# Patient Record
Sex: Female | Born: 1950 | Race: White | Hispanic: No | Smoking: Current every day smoker
Health system: Southern US, Community
[De-identification: ages and names within clinical notes are randomized; demographics above are authoritative.]

## PROBLEM LIST (undated history)

## (undated) DIAGNOSIS — K219 Gastro-esophageal reflux disease without esophagitis: Secondary | ICD-10-CM

## (undated) DIAGNOSIS — F329 Major depressive disorder, single episode, unspecified: Secondary | ICD-10-CM

## (undated) DIAGNOSIS — J45909 Unspecified asthma, uncomplicated: Secondary | ICD-10-CM

## (undated) DIAGNOSIS — R519 Headache, unspecified: Secondary | ICD-10-CM

## (undated) DIAGNOSIS — E785 Hyperlipidemia, unspecified: Secondary | ICD-10-CM

## (undated) DIAGNOSIS — N289 Disorder of kidney and ureter, unspecified: Secondary | ICD-10-CM

## (undated) DIAGNOSIS — I10 Essential (primary) hypertension: Secondary | ICD-10-CM

## (undated) DIAGNOSIS — G5600 Carpal tunnel syndrome, unspecified upper limb: Secondary | ICD-10-CM

## (undated) DIAGNOSIS — F32A Depression, unspecified: Secondary | ICD-10-CM

## (undated) DIAGNOSIS — M797 Fibromyalgia: Secondary | ICD-10-CM

## (undated) DIAGNOSIS — F319 Bipolar disorder, unspecified: Secondary | ICD-10-CM

## (undated) DIAGNOSIS — C801 Malignant (primary) neoplasm, unspecified: Secondary | ICD-10-CM

## (undated) DIAGNOSIS — R51 Headache: Secondary | ICD-10-CM

## (undated) HISTORY — PX: OTHER SURGICAL HISTORY: SHX169

## (undated) HISTORY — DX: Malignant (primary) neoplasm, unspecified: C80.1

## (undated) HISTORY — PX: TONSILLECTOMY: SUR1361

## (undated) HISTORY — PX: APPENDECTOMY: SHX54

## (undated) HISTORY — PX: ABDOMINAL HYSTERECTOMY: SHX81

---

## 1998-05-08 ENCOUNTER — Ambulatory Visit (HOSPITAL_COMMUNITY): Admission: RE | Admit: 1998-05-08 | Discharge: 1998-05-08 | Payer: Self-pay

## 1999-01-22 ENCOUNTER — Emergency Department (HOSPITAL_COMMUNITY): Admission: EM | Admit: 1999-01-22 | Discharge: 1999-01-22 | Payer: Self-pay

## 1999-01-23 ENCOUNTER — Encounter: Payer: Self-pay | Admitting: Emergency Medicine

## 1999-10-24 ENCOUNTER — Ambulatory Visit (HOSPITAL_COMMUNITY): Admission: RE | Admit: 1999-10-24 | Discharge: 1999-10-24 | Payer: Self-pay | Admitting: *Deleted

## 2001-12-16 ENCOUNTER — Ambulatory Visit: Admission: RE | Admit: 2001-12-16 | Discharge: 2001-12-16 | Payer: Self-pay | Admitting: *Deleted

## 2002-04-27 ENCOUNTER — Encounter: Admission: RE | Admit: 2002-04-27 | Discharge: 2002-04-27 | Payer: Self-pay | Admitting: Family Medicine

## 2002-04-27 ENCOUNTER — Encounter: Payer: Self-pay | Admitting: Family Medicine

## 2002-11-14 ENCOUNTER — Other Ambulatory Visit: Admission: RE | Admit: 2002-11-14 | Discharge: 2002-11-14 | Payer: Self-pay | Admitting: Family Medicine

## 2002-12-22 ENCOUNTER — Encounter: Payer: Self-pay | Admitting: Family Medicine

## 2002-12-22 ENCOUNTER — Encounter: Admission: RE | Admit: 2002-12-22 | Discharge: 2002-12-22 | Payer: Self-pay | Admitting: Family Medicine

## 2006-07-24 ENCOUNTER — Emergency Department (HOSPITAL_COMMUNITY): Admission: EM | Admit: 2006-07-24 | Discharge: 2006-07-24 | Payer: Self-pay | Admitting: Emergency Medicine

## 2006-09-13 ENCOUNTER — Emergency Department (HOSPITAL_COMMUNITY): Admission: EM | Admit: 2006-09-13 | Discharge: 2006-09-13 | Payer: Self-pay | Admitting: Emergency Medicine

## 2006-09-26 ENCOUNTER — Emergency Department (HOSPITAL_COMMUNITY): Admission: EM | Admit: 2006-09-26 | Discharge: 2006-09-27 | Payer: Self-pay | Admitting: Emergency Medicine

## 2007-08-22 ENCOUNTER — Inpatient Hospital Stay (HOSPITAL_COMMUNITY): Admission: EM | Admit: 2007-08-22 | Discharge: 2007-08-28 | Payer: Self-pay | Admitting: Emergency Medicine

## 2007-08-24 ENCOUNTER — Ambulatory Visit: Payer: Self-pay | Admitting: Psychiatry

## 2008-06-16 ENCOUNTER — Emergency Department (HOSPITAL_COMMUNITY): Admission: EM | Admit: 2008-06-16 | Discharge: 2008-06-16 | Payer: Self-pay | Admitting: Emergency Medicine

## 2008-06-26 ENCOUNTER — Emergency Department (HOSPITAL_COMMUNITY): Admission: EM | Admit: 2008-06-26 | Discharge: 2008-06-26 | Payer: Self-pay | Admitting: Emergency Medicine

## 2008-08-23 ENCOUNTER — Emergency Department (HOSPITAL_COMMUNITY): Admission: EM | Admit: 2008-08-23 | Discharge: 2008-08-23 | Payer: Self-pay | Admitting: Emergency Medicine

## 2008-12-20 ENCOUNTER — Inpatient Hospital Stay (HOSPITAL_COMMUNITY): Admission: RE | Admit: 2008-12-20 | Discharge: 2008-12-24 | Payer: Self-pay | Admitting: Urology

## 2008-12-20 ENCOUNTER — Encounter: Payer: Self-pay | Admitting: Urology

## 2009-07-27 ENCOUNTER — Emergency Department (HOSPITAL_BASED_OUTPATIENT_CLINIC_OR_DEPARTMENT_OTHER)
Admission: EM | Admit: 2009-07-27 | Discharge: 2009-07-27 | Payer: Self-pay | Source: Home / Self Care | Admitting: Emergency Medicine

## 2009-12-07 ENCOUNTER — Encounter (HOSPITAL_COMMUNITY)
Admission: RE | Admit: 2009-12-07 | Discharge: 2010-02-06 | Payer: Self-pay | Source: Home / Self Care | Admitting: Specialist

## 2010-06-22 ENCOUNTER — Encounter: Payer: Self-pay | Admitting: Family Medicine

## 2010-06-23 ENCOUNTER — Encounter: Payer: Self-pay | Admitting: Family Medicine

## 2010-07-01 ENCOUNTER — Emergency Department (HOSPITAL_COMMUNITY)
Admission: EM | Admit: 2010-07-01 | Discharge: 2010-07-01 | Payer: Self-pay | Source: Home / Self Care | Admitting: Emergency Medicine

## 2010-07-01 LAB — POCT I-STAT, CHEM 8
BUN: 10 mg/dL (ref 6–23)
Calcium, Ion: 0.91 mmol/L — ABNORMAL LOW (ref 1.12–1.32)
Chloride: 90 mEq/L — ABNORMAL LOW (ref 96–112)
Glucose, Bld: 107 mg/dL — ABNORMAL HIGH (ref 70–99)
HCT: 36 % (ref 36.0–46.0)
TCO2: 31 mmol/L (ref 0–100)

## 2010-07-01 LAB — POCT CARDIAC MARKERS

## 2010-07-01 LAB — CBC
MCH: 30.5 pg (ref 26.0–34.0)
MCV: 90.3 fL (ref 78.0–100.0)
Platelets: 256 10*3/uL (ref 150–400)
RDW: 13 % (ref 11.5–15.5)

## 2010-07-01 LAB — DIFFERENTIAL
Basophils Relative: 0 % (ref 0–1)
Eosinophils Absolute: 0.2 10*3/uL (ref 0.0–0.7)
Eosinophils Relative: 2 % (ref 0–5)
Lymphs Abs: 1.4 10*3/uL (ref 0.7–4.0)
Monocytes Relative: 6 % (ref 3–12)

## 2010-09-08 LAB — DIFFERENTIAL
Basophils Absolute: 0 10*3/uL (ref 0.0–0.1)
Basophils Relative: 0 % (ref 0–1)
Eosinophils Absolute: 0 10*3/uL (ref 0.0–0.7)
Monocytes Absolute: 0.8 10*3/uL (ref 0.1–1.0)
Monocytes Relative: 7 % (ref 3–12)
Neutro Abs: 10.3 10*3/uL — ABNORMAL HIGH (ref 1.7–7.7)

## 2010-09-08 LAB — CBC
Hemoglobin: 14.5 g/dL (ref 12.0–15.0)
MCHC: 33.2 g/dL (ref 30.0–36.0)
MCV: 92.3 fL (ref 78.0–100.0)
RBC: 4.73 MIL/uL (ref 3.87–5.11)
RDW: 14.9 % (ref 11.5–15.5)

## 2010-09-08 LAB — BASIC METABOLIC PANEL
BUN: 7 mg/dL (ref 6–23)
BUN: 7 mg/dL (ref 6–23)
CO2: 31 mEq/L (ref 19–32)
CO2: 31 mEq/L (ref 19–32)
CO2: 34 mEq/L — ABNORMAL HIGH (ref 19–32)
Calcium: 8.4 mg/dL (ref 8.4–10.5)
Chloride: 93 mEq/L — ABNORMAL LOW (ref 96–112)
Chloride: 94 mEq/L — ABNORMAL LOW (ref 96–112)
Chloride: 97 mEq/L (ref 96–112)
Creatinine, Ser: 0.92 mg/dL (ref 0.4–1.2)
Creatinine, Ser: 1.02 mg/dL (ref 0.4–1.2)
Glucose, Bld: 111 mg/dL — ABNORMAL HIGH (ref 70–99)
Glucose, Bld: 133 mg/dL — ABNORMAL HIGH (ref 70–99)
Potassium: 3.5 mEq/L (ref 3.5–5.1)
Potassium: 4.1 mEq/L (ref 3.5–5.1)

## 2010-10-15 NOTE — Op Note (Signed)
NAMEMarland Kitchen  Theresa Pennington, Theresa Pennington                ACCOUNT NO.:  [id number]   MEDICAL RECORD NO.:  [id number]          PATIENT TYPE:  INP   LOCATION:  1433                         FACILITY:  [facility]   PHYSICIAN:  Bertram Millard. Dahlstedt, M.D.DATE OF BIRTH:  [birth year]   DATE OF PROCEDURE:  12/20/2008  DATE OF DISCHARGE:                               OPERATIVE REPORT   PREOPERATIVE DIAGNOSIS:  Right renal mass.   POSTOPERATIVE DIAGNOSIS:  Right renal mass.   PROCEDURE PERFORMED:  1. Right laparoscopic radical nephrectomy.  2. Laparoscopic lysis of adhesions.   SURGEON:  Dr. Marcine Matar.   RESIDENT:  Edward Qualia.   NURSE PRACTITIONER:  Theresa Duty.   DRAINS:  A 16-French Foley catheter to gravity.   COMPLICATIONS:  None.   ANESTHESIA:  General endotracheal anesthesia.   INDICATIONS FOR PROCEDURE:  Theresa Pennington is a 60 year old Caucasian female  who was found to have a right renal mass approximately 6 cm in size.  This was emanating from the upper pole.  The contralateral kidney is  normal.  She is felt to have localized disease based on her preoperative  evaluation and we therefore recommended radical nephrectomy.  After  informed consent was obtained, she was brought to the operating room for  the above-mentioned procedure.   DESCRIPTION OF PROCEDURE IN DETAIL:  Mr. Cabeza was brought back to the  operating room and identified via wrist band.  A preoperative timeout  was called to confirm correct patient, procedure and site.  After the  successful induction of general anesthesia, she was positioned in the  modified flank position using a beanbag.  All of her pressure points  were padded appropriately to help reduce the risk of neurapraxia.  A  Foley catheter was inserted into her bladder.  IV antibiotics were  administered.  Her abdomen and flank were prepped and draped in the  usual sterile fashion.  The surgeons were sterilely gowned and gloved.    Approximately 1 cm above the  umbilicus, a 1 cm incision was made.  We  dissected down through Scarpa's and the rectus sheath using a Hassan  technique.  We placed our camera port.  We inspected the abdomen, there  were some adhesions located in the midline.  We inserted a 5 mm port in  the subxiphoid region and an additional 5 mm port was inserted and the  right upper quadrant.  We performed a laparoscopic lysis of adhesions  using sharp and blunt dissection.  Another 5 mm port was then positioned  in the right lower quadrant.   We began by mobilizing the right colon.  Once the right colon was  mobilized, we identified the inferior pole of the right kidney.  We  dissected down to Gerota's fascia.  We then worked our way laterally and  freed up the lateral edge of the kidney.  Once this was accomplished, we  worked towards the upper pole the right kidney where the mass was  evident.  The hepatorenal ligament was divided.  We then proceeded to  identification of the right ureter and right gonadal vein which  was seen  overlying the psoas muscle.  The gonadal and right ureter were then  clipped with Hem-o-lok clips x2.  These were subsequently divided and  the inferior pole of the kidney was further mobilized.   We then turned our dissection towards the hilum. Gonadal vessels were  identified as was the vena cava.  The renal vein was seen coming into  the vena cava.  We then were able to identify a branch of the renal  artery just inferior and posterior to the renal vein.  This was  carefully isolated and subsequently two Hem-o-lok clips were secured and  placed on this renal artery.  We then proceeded superiorly and there was  evidence of another branch to renal artery was still perfusing the upper  pole of the kidney.  This was subsequently isolated and Hem-o-lok clips  were applied x3.  Both of these renal arteries once there were some  secured, there were subsequently divided.  The renal vein was then  secured with  Hem-o-lok clips x3 and divided as well.  The specimen was  then freed from its remaining attachments using blunt and sharp  dissection.  The specimen was then placed in a large EndoCatch bag.  The  adrenal was taken with the specimen as the patient had a large tumor in  the upper pole.  The bed of the wound was then closely inspected as was  the hilum.  Hemostasis was excellent.   The midline incision was then extended approximately 4 cm to retrieve  the specimen.  The fascia of the midline wound was then closed using #1  PDS in a running fashion.  The skin was then closed with staples and the  procedure was terminated.   Please note, Dr. Marcine Matar was present, available, and  participated in all aspects of this patient's care.   DISPOSITION:  The patient tolerated the procedure well, was extubated  and transported to the PACU in stable condition.     ______________________________  Nani Ravens. Dahlstedt, M.D.  Electronically Signed    KR/MEDQ  D:  12/22/2008  T:  12/22/2008  Job:  [id number]
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
## 2010-10-15 NOTE — Discharge Summary (Signed)
NAMEJEDA, Theresa Pennington                ACCOUNT NO.:  [id number]   MEDICAL RECORD NO.:  [id number]          PATIENT TYPE:  INP   LOCATION:  1501                         [facility]   PHYSICIAN:  Herbie Saxon, MDDATE OF BIRTH:  May 25, 1951   DATE OF ADMISSION:  08/22/2007  DATE OF DISCHARGE:  08/26/2007                               DISCHARGE SUMMARY   DISCHARGE DIAGNOSES:  1. Altered mental status, resolved.  2. Urinary tract infection.  3. Depression.  4. Chronic pain syndrome.  5. Fibromyalgia.  6. Hypertension, stable.  7. Chronic lacunar infarcts.  8. Dyslipidemia.  9. Hyponatremia, improved.  10.Tobacco abuse.  11.Leukocytosis, resolved.  12.Anemia of chronic disease.   CONSULTATIONS:  1. Dr. Doristine Counter, psychiatry.  2. Palliative care team for pain management evaluation.  This was      performed by Asencion Noble, NP   HOSPITAL COURSE:  This 60 year old Caucasian lady with a known history  of psychotic disorder having been seen by Jamas Lav, M.D.,  presented to the emergency room at [facility] with  confusion and wondering around.  The patient's speech was disoriented.  She was hyponatremic with a sodium of 129.  Urinalysis was consistent  with a urinary tract infection with 20 white blood cells and large  leukocyte esterase.  The head CT showed some lacunar infarcts, but MRI  confirmed that these are old infarcts.  No acute infarcts are  identified.  The patient was started on IV Cipro and IV fluid normal  saline hydration.  Her urinary tract infection and hyponatremia is much  improved.  The patient is ambulating, unable to converse appropriately.  Amlodipine was added to optimize her blood pressure control and she was  also on p.r.n. laxatives and she has been sent home on stool softeners  as she has chronic constipation.   CONDITION ON DISCHARGE:  Stable.   DIET:  Low sodium, heart healthy, low cholesterol.   ACTIVITY:   Increase slowly.  She has been ordered a soft cervical  collar.   FOLLOWUP:  Follow up with primary care physician, Dr. Biagio Quint at [facility], however, the patient may wish to also follow up at [facility] in 1 week per her desire.  Follow up with psychiatrist, Dr.  Raquel James in 3-5 days.   DISCHARGE MEDICATIONS:  1. Cipro 250 mg b.i.d. for 3 more days.  2. Colace 100 mg b.i.d.  3. MiraLax 17 grams p.o. daily as necessary.  4. Amlodipine 5 mg p.o. daily.  5. Soft cervical collar.  6. Hemocyte Plus one tablet daily.   We will continue with all of her previous home medications and this is  to be reviewed by her primary care physician as an outpatient.   PHYSICAL EXAMINATION:  GENERAL:  She is an elderly lady in no acute  distress.  VITAL SIGNS:  Temperature 98, pulse 100, respiratory rate 21, blood  pressure 140/80.  HEENT:  Pupils equal, round, and reactive to light and accommodation.  NECK:  Supple.  No submandibular lymph nodes.  HEART:  Sounds 1 and 2  regular.  CHEST:  Clinically clear.  ABDOMEN:  Soft, nontender, and no organomegaly.  Bowel sounds are  normal.  NEUROLOGY:  She is alert and oriented to time, place, and person.  Peripheral pulses are present with no pedal edema.  Deep tendon reflexes  are 2+ globally.  Power is 5 globally.   LABORATORY DATA:  Sodium 134, potassium 4.2, chloride 102, bicarbonate  27, glucose 104, BUN 5, creatinine 0.7.  WBC 7, hematocrit 27.6,  platelet count 382.  Blood culture negative so far.   RADIOLOGY:  The MRI of brain on August 24, 2007, shows no evidence of  acute ischemia.  There are probable old ischemic lacuna involving the  left frontal subcortical white matter.   Chest x-ray on August 22, 2007, showed no active cardiopulmonary disease.      Herbie Saxon, MD  Electronically Signed     MIO/MEDQ  D:  08/26/2007  T:  08/26/2007  Job:  [id number]   cc:   Dr. Margarette Asal, Burna Forts, M.D.  Fax:  [phone]
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
## 2010-10-15 NOTE — Consult Note (Signed)
NAME:  Theresa Pennington, Theresa Pennington NO.:  [id number]   MEDICAL RECORD NO.:  [id number]          PATIENT TYPE:  INP   LOCATION:  1501                         FACILITY:  [facility]   PHYSICIAN:  Anselm Jungling, MD  DATE OF BIRTH:  10-Feb-1951   DATE OF CONSULTATION:  08/24/2007  DATE OF DISCHARGE:                                 CONSULTATION   IDENTIFYING DATA AND REASON FOR REFERRAL:  The patient is a 60 year old  woman who was admitted to [facility] for problems related to  mental status changes, and subsequent identification of urinary tract  infection.  The patient also has a history of chronic pain,  fibromyalgia, depression, and is under the care of a psychiatrist.  Psychiatric consultation is requested to review all of this and make  recommendations.   HISTORY OF THE PRESENTING PROBLEMS:  The following information is  obtained from the patient's medical record, and the patient herself, who  is not only not appearing to be a very reliable historian, but who asked  me to leave after a few minutes, stating that she was in too much pain  and too confused to respond to my questions, even though she did not  appear confused to me.   The patient states that she sees Dr. Jules Schick, a local psychiatrist.  Her current outpatient psychotropic regimen include Cymbalta 60 mg  daily; Provigil, dosage unclear; Neurontin 600 mg t.i.d., trazodone 150  mg at bedtime, and Valium 2.5 mg t.i.d..  In addition, she receives  fentanyl patch 75 mcg q.72 h, Flexeril, hydrochlorothiazide, metoprolol,  Tricor, Lasix, lisinopril, and potassium.   She was admitted due to mental status changes.  Since her admission, her  mental status abnormalities have apparently cleared.  There has been a  CT scan, without contrast, that noted no clear acute abnormalities, but  a small left basal ganglia infarct.  The radiologist noted that this  noncontrast study would not show an acute infarct.  It is  it is not  clear whether this constituted recommendation for a contrast CT study or  an MRI.   Urine drug screen was positive for benzodiazepines, consistent with her  prescription of Valium.   The patient reportedly has a history of chronic pain due to  fibromyalgia, hence her treatment with fentanyl, Flexeril, and  presumably Neurontin.  Chart information indicates that a pain  management consultation may be obtained while the patient is here.   I note no social service notes in the chart; therefore, it is not clear  to me what this patient's present support system or living arrangement  is.   MENTAL STATUS AND OBSERVATIONS:  The patient has a petite woman who is  sitting on the edge of her bed.  She is hunched over, and the nurse is  administering some pain medication to her as I come in.  She is awake,  alert, and fully oriented, correctly telling me how long she has been  here, what day today is, and the reasons for her admission.  She  accurately states that she was admitted because  of some mental status  changes and admits to having been confused.  She states, I might have  taken my medication wrong, but I don't think so.  When I begin to ask  her more questions about her medication, who prescribes them, and so on,  she first states that her medication is prescribed by several doctors  who are supposed to work together, and then she indicates that she does  not feel she can continue with my questioning.   The patient does appear depressed.  There is nothing to suggest any  underlying formal thought disorder, psychosis, or present delirium or  confusion, although the patient indicated that she was confused as a  reason for me not proceeding with further interviewing.   IMPRESSION:  The impression is not complete at this time.  I do have  concerns about the patient's use of medication.  There is certainly a  large possibility that the patient's mental status changes were due  to  inappropriate usage of medication which could be accidental, or could be  intentional, with a variety of possible different motivations, including  which would be overuse of medications due to dependency and increase  tolerance.  It will be difficult to get at this question without  interviewing of persons close to her, such as family members, spouse,  etc., if available to be interviewed separately.   The patient apparently does have a history of mood disorder, but there  is always the possibility that her mood-related symptoms may be more a  function of substance use or abuse.   This is an individual who my intuition tells me is likely being  prescribed more medication than is actually indicated, and who may have  a substance abuse issue.   DIAGNOSTIC IMPRESSION:   AXIS I:  Rule out polysubstance abuse slash/not otherwise specified.  History of pain disorder.  History of mood disorder, not otherwise specified.  Rule out substance induced mood disorder.  Status post substance-induced confusional state.   AXIS II:  Deferred.   AXIS III:  Urinary tract infection.  Hypertension.  Chronic pain disorder.  Fibromyalgia.   AXIS IV:  Stressors severe.   AXIS V:  Global assessment of function 50.   RECOMMENDATIONS:  I agree with the recommendation for a pain management  consultation, in hopes that it would clarify whether this patient's  usual analgesic regimen make sense within the context of what is known  about her medical condition and its likely analgesic needs.   There is some question as to whether or not any further imaging studies  are required based on the findings of the recent noncontrast CT scan.   If it appears, from further investigation, family interviewing, etc.,  that the patient has any significant substance abuse issue and is in  need of detoxification, I would recommend consideration of her transfer  to the inpatient psychiatric service for such  purposes.  I would be glad  to reassess at any time.  Thank you for involving me in this patient's  care.      Anselm Jungling, MD  Electronically Signed     SPB/MEDQ  D:  08/24/2007  T:  08/24/2007  Job:  [id number]

## 2010-10-18 NOTE — Discharge Summary (Signed)
NAMEMarland Pennington  Theresa Pennington, Theresa Pennington                ACCOUNT NO.:  [id number]   MEDICAL RECORD NO.:  [id number]          PATIENT TYPE:  INP   LOCATION:  1433                         [facility]ACILITY:  [facility]   PHYSICIAN:  Bertram Millard. Dahlstedt, M.D.DATE OF BIRTH:  02-Mar-1951   DATE OF ADMISSION:  12/20/2008  DATE OF DISCHARGE:  12/24/2008                               DISCHARGE SUMMARY   ADMISSION DIAGNOSIS:  Right renal mass.   DISCHARGE DIAGNOSIS:  Right renal mass.   PROCEDURES:  1. Right laparoscopic-assisted radical nephrectomy.  2. Laparoscopic lysis of adhesions.   History and physical, for full details please see admission history and  physical.  Briefly, Theresa Pennington is a 60 year old female who was found to  have a right renal mass approximately 6 cm in size.  This was emanating  from the upper pole.  The contralateral kidney is normal.  She is felt  to have localized disease based on her preoperative evaluation, and we  therefore recommended radical nephrectomy.  After discussion regarding  treatment options, she did elect to proceed with a laparoscopic-assisted  right radical nephrectomy.   HOSPITAL COURSE:  On December 20, 2008 she was taken to the operating room  where she underwent the above-named procedure which she tolerated well  without complications.  Postoperatively she was able to be transferred  to regular hospital room where she was monitored overnight and remained  hemodynamically stable.  Her postoperative day #1 hemoglobin was found  to be stable at 14.5.  Her serum creatinine was also found to be stable  by her postoperative creatinine of 0.92 and again on postoperative day  #1 of 1.02.  She was able to begin a clear liquid diet on postoperative  day #1 in which she tolerated without nausea or vomiting.  She was able  to be transitioned to a regular diet over the next 24-48 hours.  She  began ambulating after bedrest for 24 hours and which she did with  continual encouragement.  She was  transitioned to oral pain medications  on postoperative day #2.  She maintained excellent urine output within  the first 24 hours after surgery.  Therefore her Foley catheter was  removed.  Her blood pressure was elevated on postoperative day #1.  Therefore her antihypertensive medications were restarted per her home  dose.  Her blood pressure then normalized throughout the rest of her  hospitalization.  She did have low grade fevers which resolved by  postoperative day #3 after encouragement to use incentive spirometry and  ambulation.  On postoperative day number #4 her pain was under control.  She was ambulating well and tolerating her regular diet without any  difficulty.  She had also had return of bowel function.  Therefore she  was ready to be discharged home in excellent condition.   DISPOSITION:  Home.   DISCHARGE MEDICATIONS:  She was instructed to resume all regular home  medications.  In addition she was provided Percocet to use as needed for  pain and told to use Colace as a stool softener.   DISCHARGE INSTRUCTIONS:  She was instructed to  be ambulatory, but  specifically told to refrain from any heavy lifting, strenuous activity  or driving.  She was instructed to resume her regular diet.   FOLLOW UP:  She will return in 10-14 days for further postoperative  evaluation.   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      Delia Chimes, NP      Bertram Millard. Dahlstedt, M.D.  Electronically Signed    MA/MEDQ  D:  01/05/2009  T:  01/05/2009  Job:  [id number]

## 2011-02-24 LAB — URINE MICROSCOPIC-ADD ON

## 2011-02-24 LAB — BASIC METABOLIC PANEL
BUN: 5 — ABNORMAL LOW
BUN: 5 — ABNORMAL LOW
CO2: 27
CO2: 29
Calcium: 8.6
Chloride: 87 — ABNORMAL LOW
Creatinine, Ser: 0.69
GFR calc non Af Amer: >60
Glucose, Bld: 104 — ABNORMAL HIGH
Glucose, Bld: 123 — ABNORMAL HIGH

## 2011-02-24 LAB — DIFFERENTIAL
Basophils Relative: 0
Eosinophils Absolute: 0
Monocytes Relative: 6
Neutrophils Relative %: 86 — ABNORMAL HIGH

## 2011-02-24 LAB — URINALYSIS, ROUTINE W REFLEX MICROSCOPIC
Hgb urine dipstick: NEGATIVE
Ketones, ur: NEGATIVE
Protein, ur: NEGATIVE
Urobilinogen, UA: 0.2

## 2011-02-24 LAB — COMPREHENSIVE METABOLIC PANEL
ALT: 31
AST: 42 — ABNORMAL HIGH
Albumin: 3 — ABNORMAL LOW
Alkaline Phosphatase: 66
Chloride: 97
GFR calc Af Amer: >60
Potassium: 3.3 — ABNORMAL LOW
Sodium: 134 — ABNORMAL LOW
Total Bilirubin: 1.2
Total Protein: 5.9 — ABNORMAL LOW

## 2011-02-24 LAB — CBC
HCT: 30.1 — ABNORMAL LOW
MCHC: 34.1
MCHC: 34.4
MCV: 82.7
Platelets: 382
Platelets: 398
Platelets: 544 — ABNORMAL HIGH
RDW: 14.2
RDW: 14.2
WBC: 8.2

## 2011-02-24 LAB — CULTURE, BLOOD (ROUTINE X 2): Culture: NO GROWTH

## 2011-02-24 LAB — RAPID URINE DRUG SCREEN, HOSP PERFORMED
Amphetamines: NOT DETECTED
Barbiturates: NOT DETECTED
Benzodiazepines: POSITIVE — AB
Cocaine: NOT DETECTED

## 2011-03-20 ENCOUNTER — Encounter: Payer: Self-pay | Admitting: Neurology

## 2011-04-02 ENCOUNTER — Ambulatory Visit: Payer: Self-pay | Admitting: Neurology

## 2011-07-04 IMAGING — CT CT HEAD W/O CM
1 series · 16 of 30 positions shown, 20 images · non-contrast
Comparison: 08/22/2007

CLINICAL DATA: Laceration post fall.  History of renal cell
carcinoma.

CT HEAD WITHOUT CONTRAST
TECHNIQUE: Contiguous axial images were obtained from the base of
the skull through the vertex without contrast.

[Series 2: headseq 4.8 h45s · axial · 0.43mm/px · z∈[-131,-3]mm · 16 of 30 slices shown, 20 images]
[im 2/30  brain]
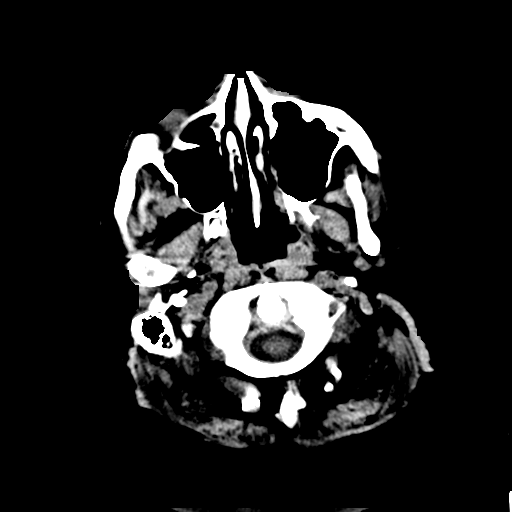
[im 2/30  bone]
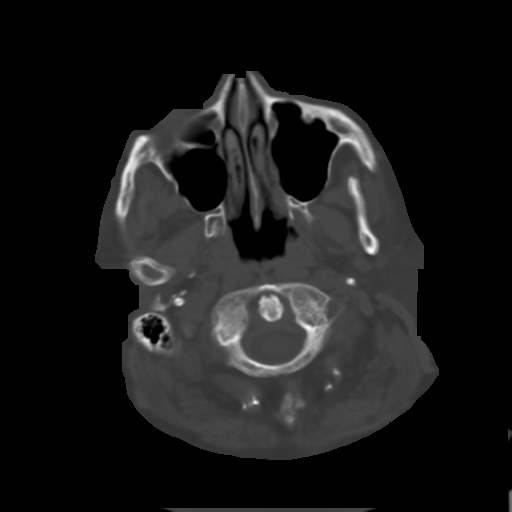
[im 4/30  brain]
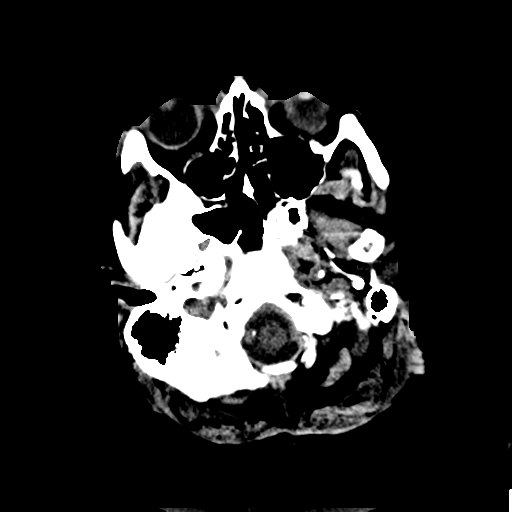
[im 6/30  brain]
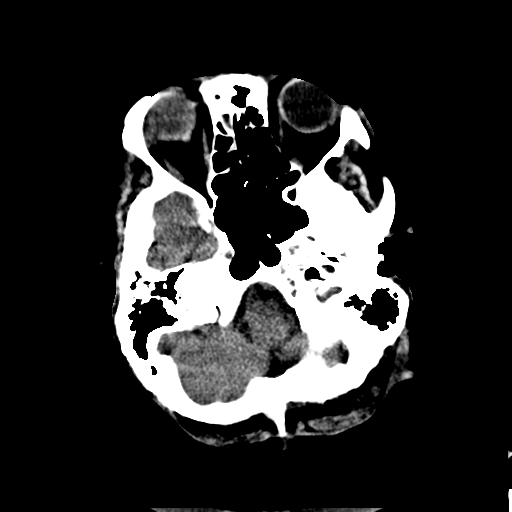
[im 8/30  brain]
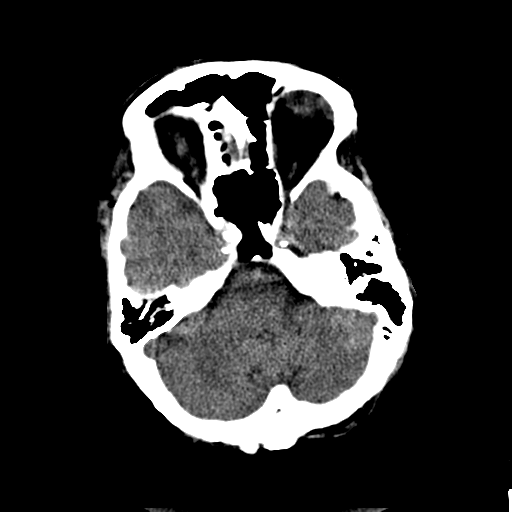
[im 9/30  brain]
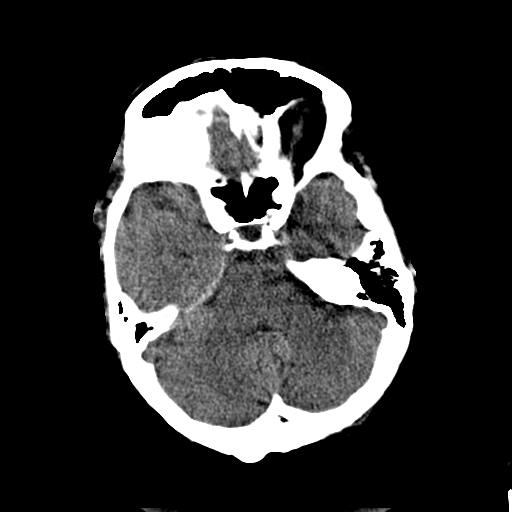
[im 9/30  bone]
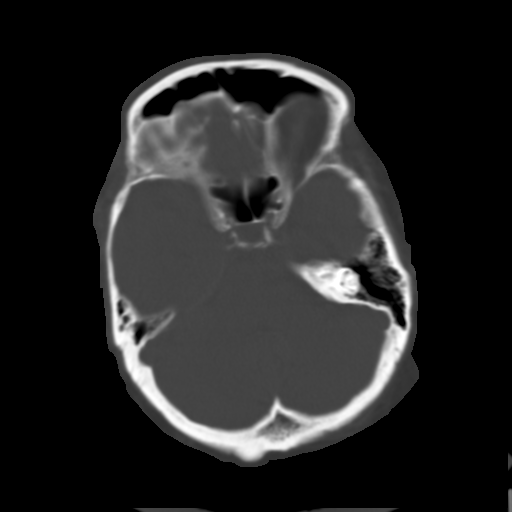
[im 11/30  brain]
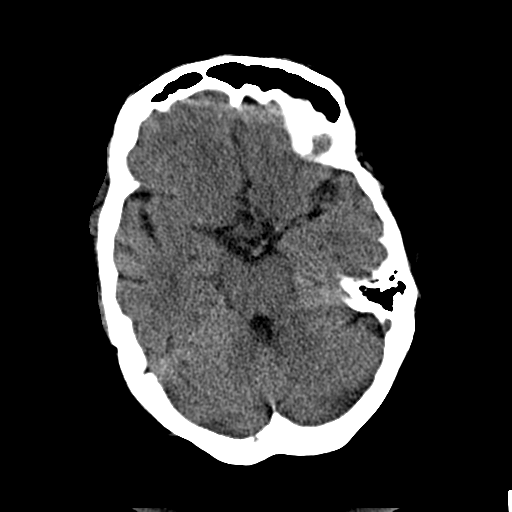
[im 13/30  brain]
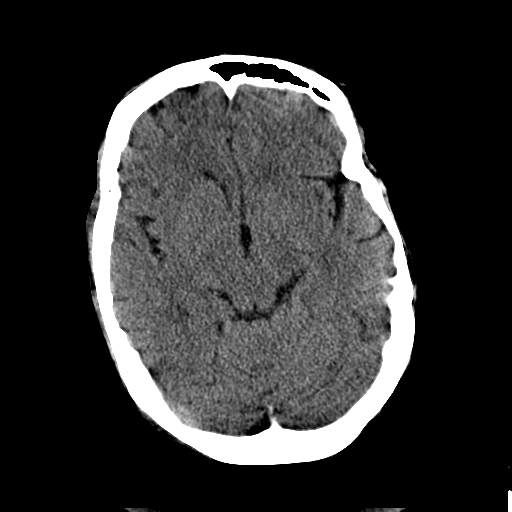
[im 15/30  brain]
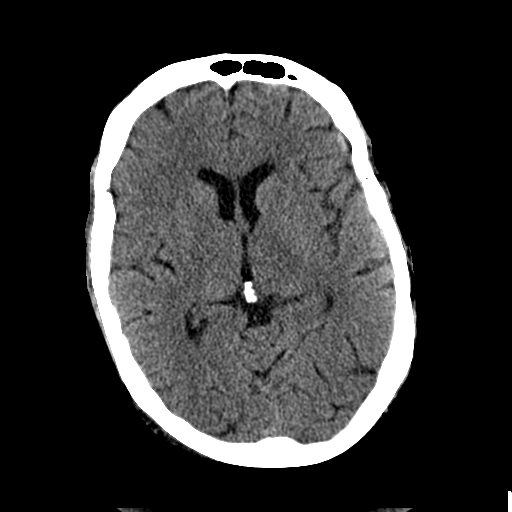
[im 16/30  brain]
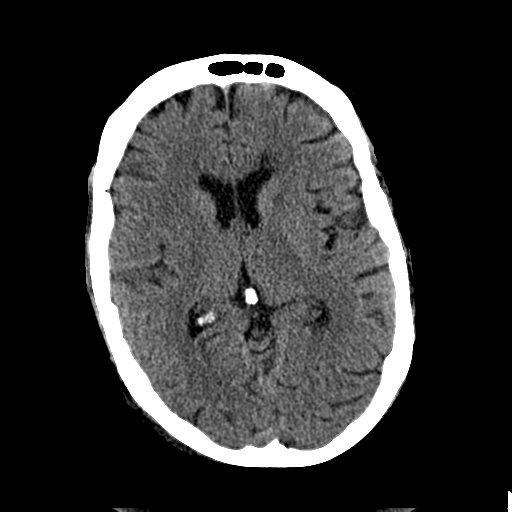
[im 16/30  bone]
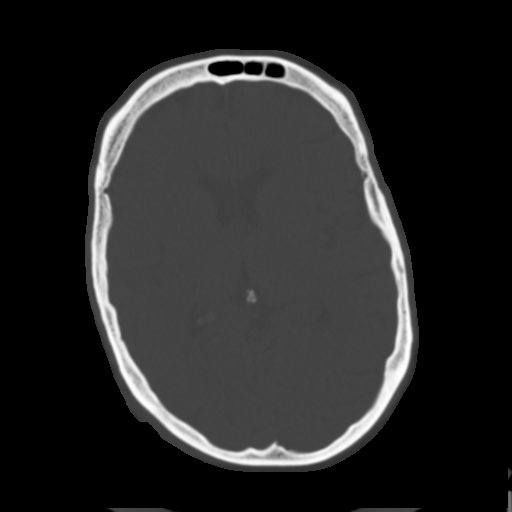
[im 18/30  brain]
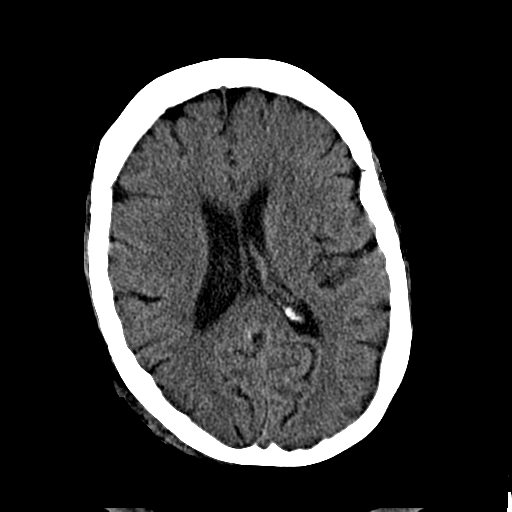
[im 20/30  brain]
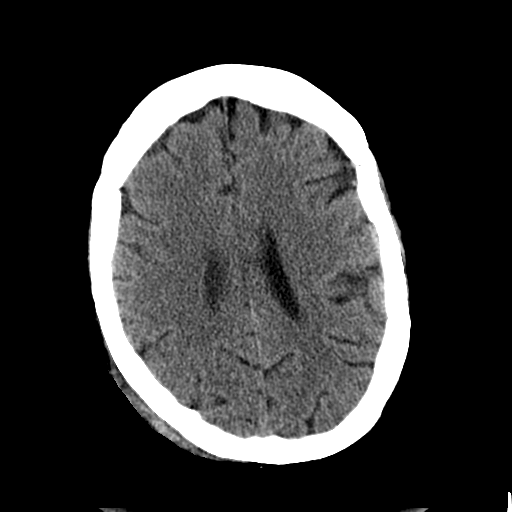
[im 22/30  brain]
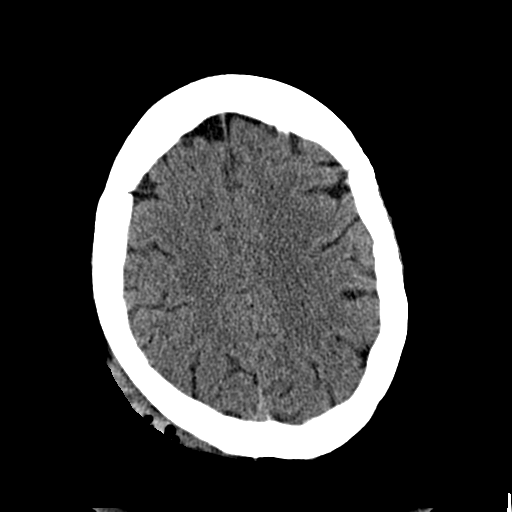
[im 23/30  brain]
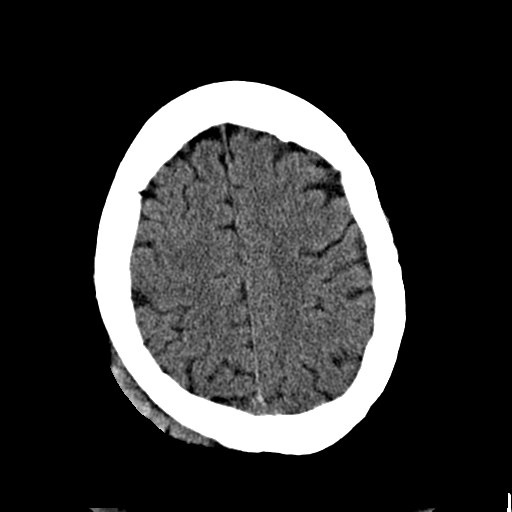
[im 23/30  bone]
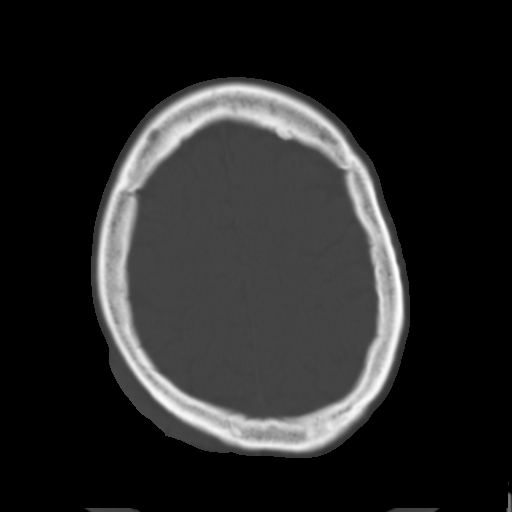
[im 25/30  brain]
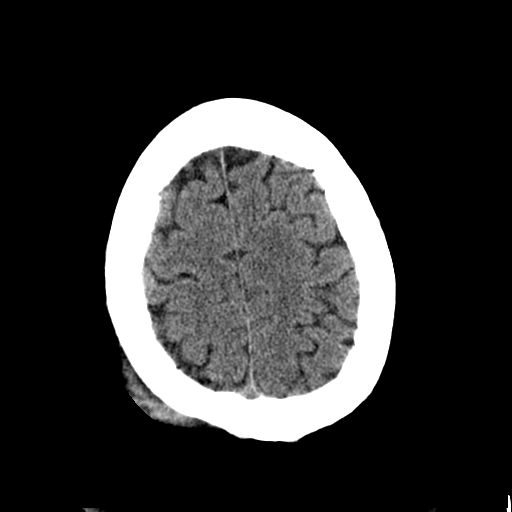
[im 27/30  brain]
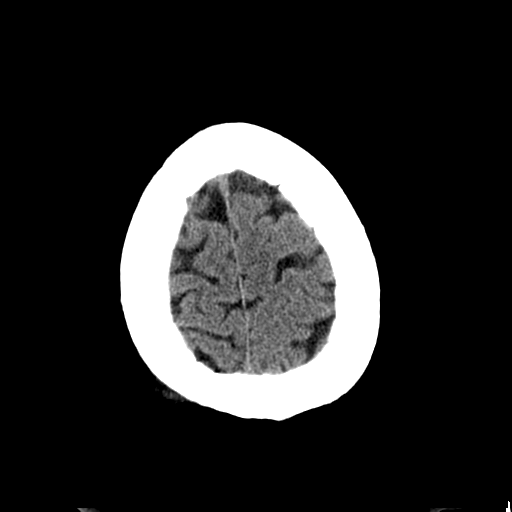
[im 29/30  brain]
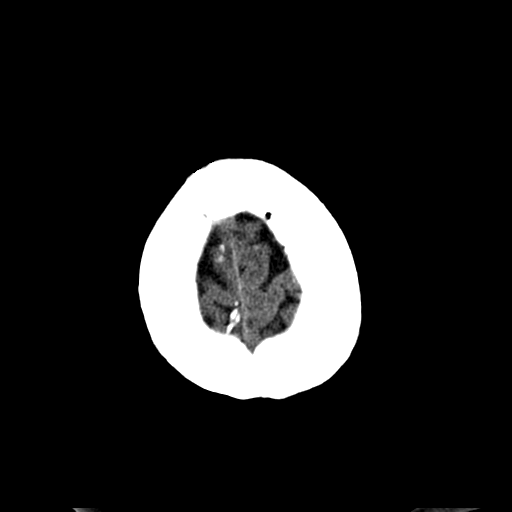

[16 of 30 positions shown; findings below may reference images not displayed]

FINDINGS: There is right posterior parietal scalp soft tissue
swelling.  Stable sub centimeter lacunar infarct in the left basal
ganglia and caudate nucleus. Atherosclerotic and physiologic
intracranial calcifications.  Cervical fixation hardware partially
seen. There is no evidence of acute intracranial hemorrhage, brain
edema, mass lesion, acute infarction,   mass effect, or midline
shift. Acute infarct may be inapparent on noncontrast CT.  No other
intra-axial abnormalities are seen, and the ventricles and sulci
are within normal limits in size and symmetry.   No abnormal extra-
axial fluid collections or masses are identified.  No significant
calvarial abnormality.
IMPRESSION: 1. Negative for bleed or other acute intracranial process.
2.  Stable left basal ganglia and caudate nucleus lacunar infarct.

## 2011-07-04 IMAGING — CR DG CHEST 2V
2 series · 2 of 2 positions shown · non-contrast
Comparison: Chest x-ray 08/22/2007.

CLINICAL DATA: Fell.  Chest pain.

CHEST - 2 VIEW

[view not recorded (1 of 2)]
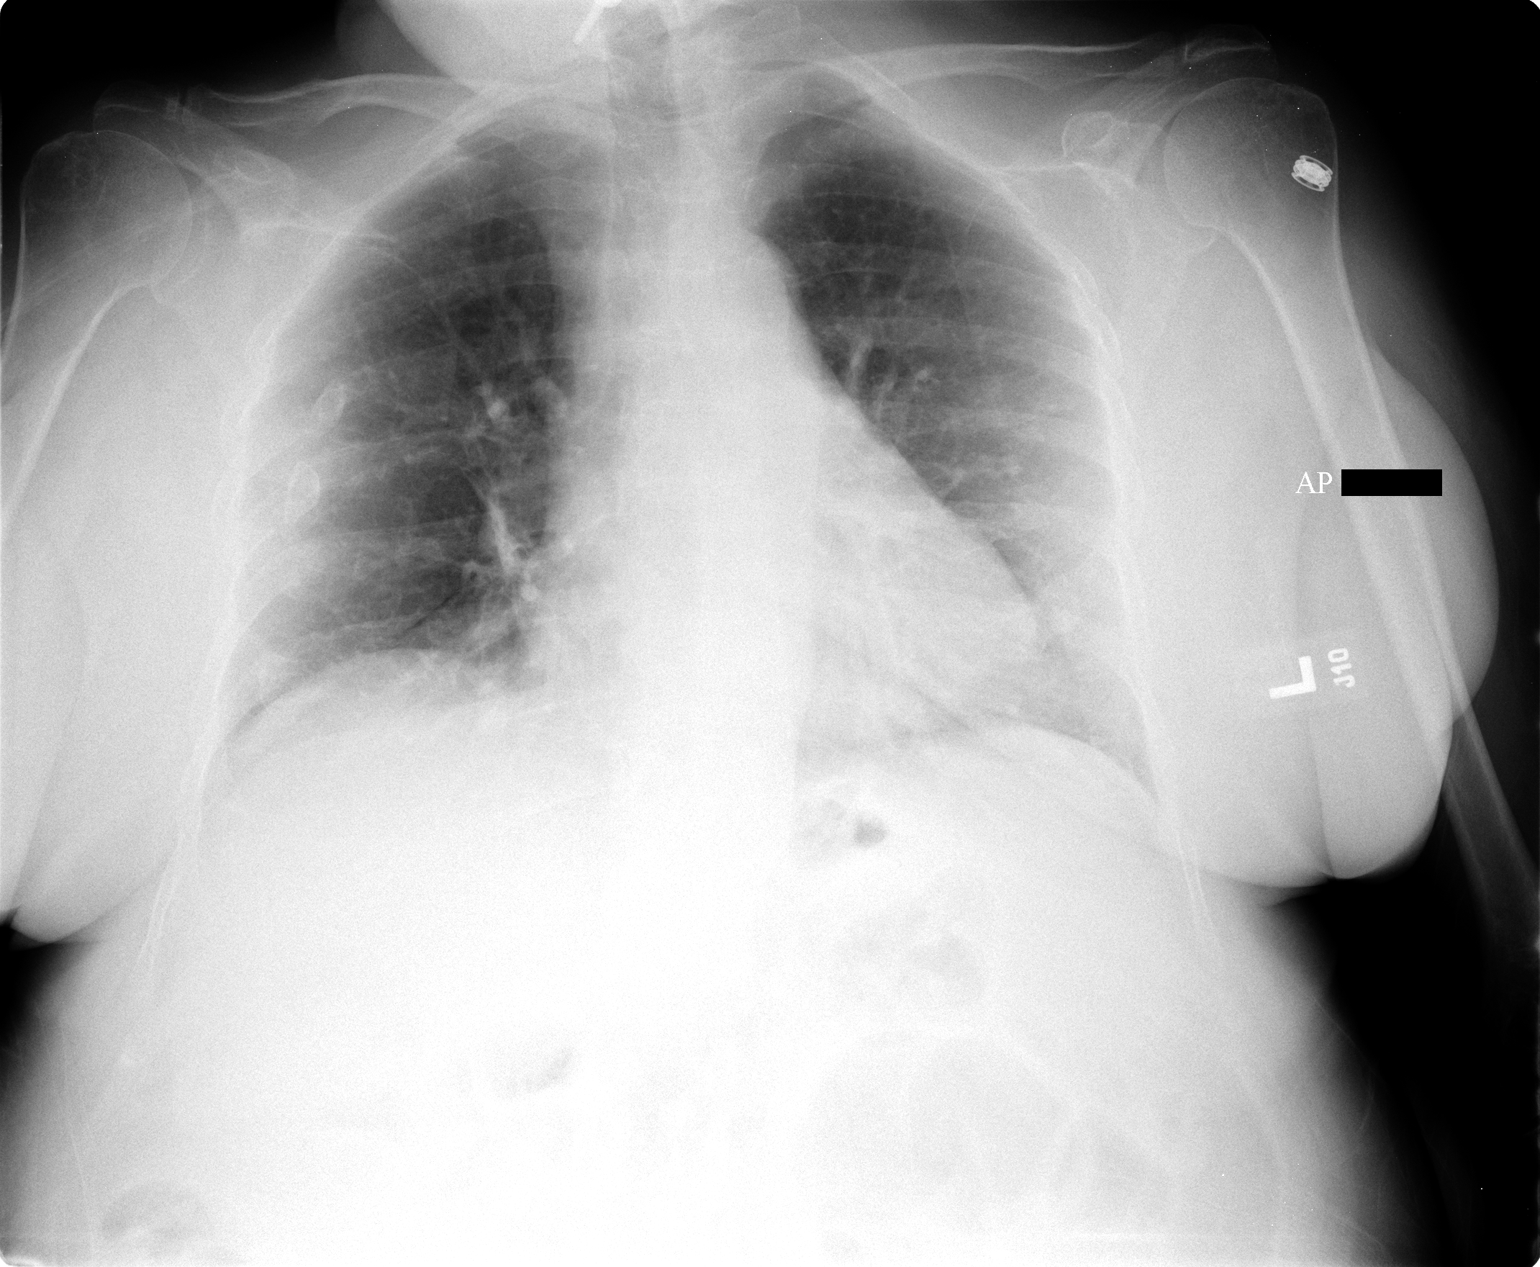

[view not recorded (2 of 2)]
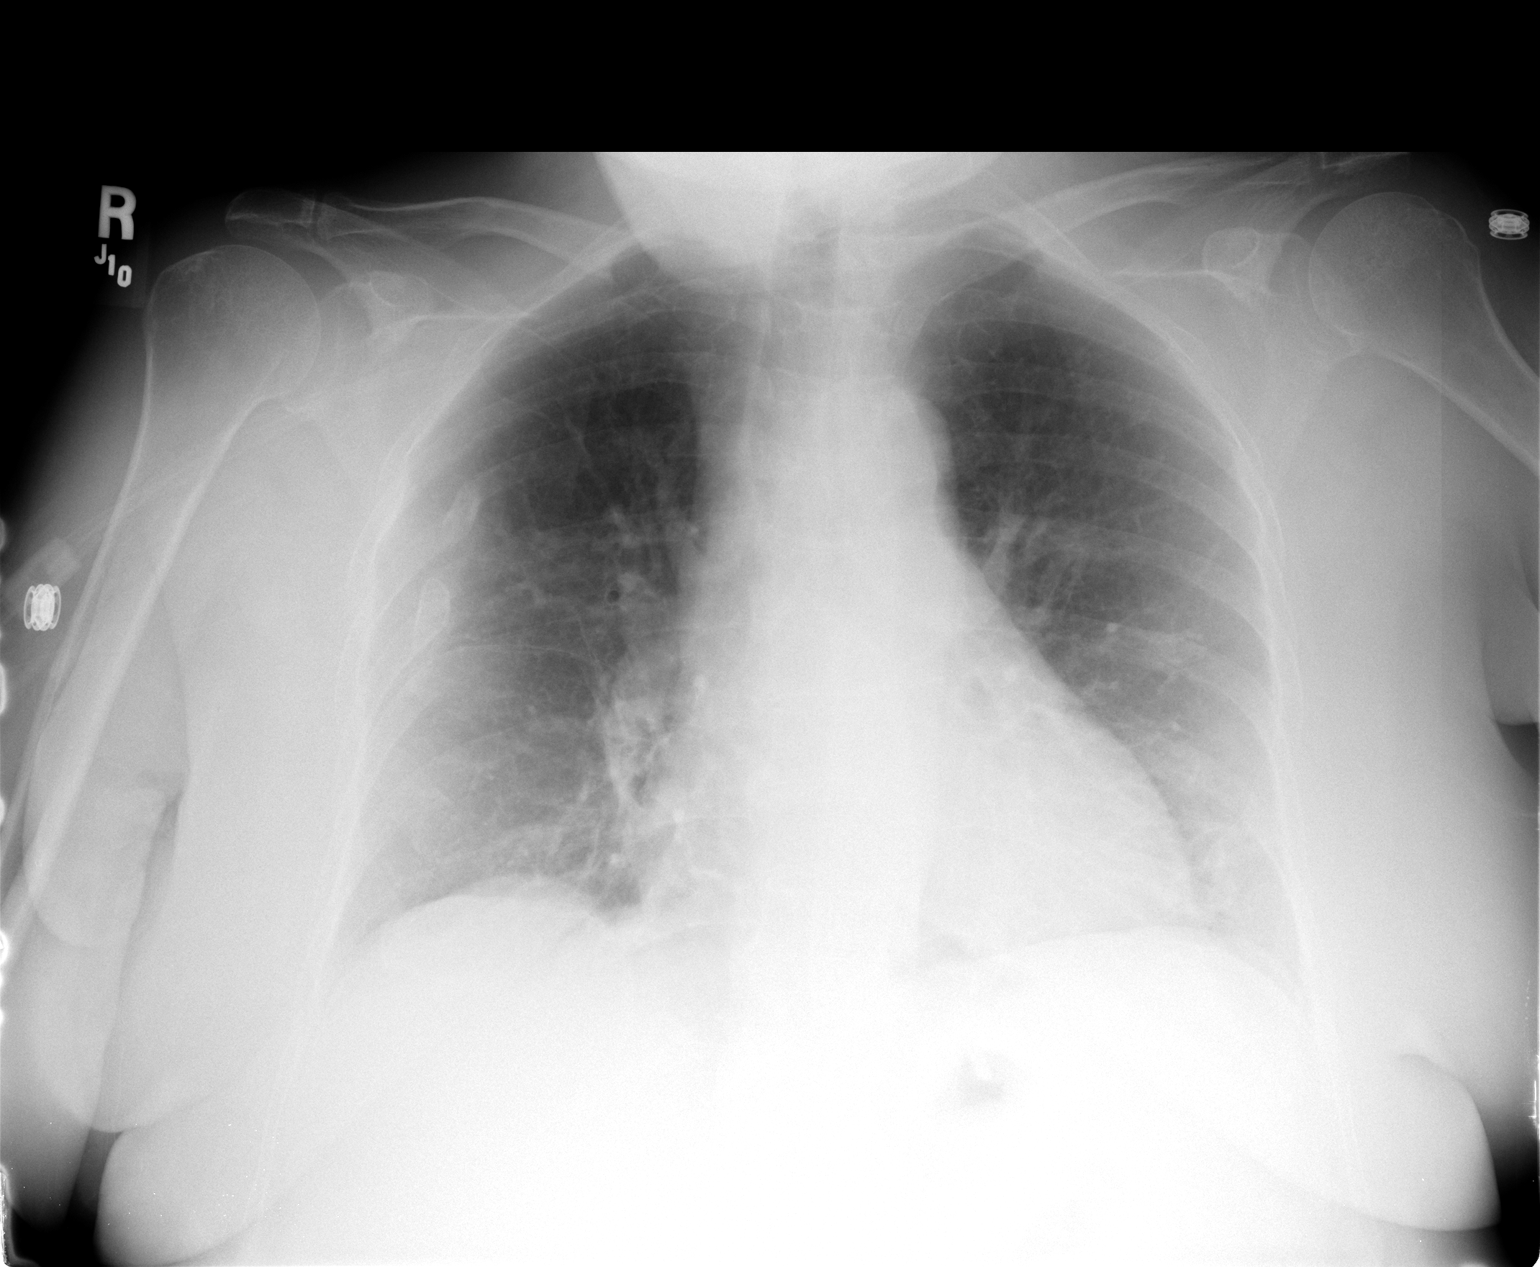

[2 of 2 positions shown; findings below may reference images not displayed]

FINDINGS: The cardiac silhouette, mediastinal and hilar contours
are within normal limits and stable.  Low lung volumes with mild
vascular crowding streaky basilar atelectasis but no infiltrates,
edema or effusion.  There are remote healed rib fractures but no
definite acute fracture.
IMPRESSION: 1.  Low lung volumes with vascular crowding and streaky
atelectasis.
2.  Remote healed rib fractures.  No definite acute fracture.

## 2011-12-10 ENCOUNTER — Emergency Department (HOSPITAL_COMMUNITY): Payer: Medicare Other

## 2011-12-10 ENCOUNTER — Emergency Department (HOSPITAL_COMMUNITY)
Admission: EM | Admit: 2011-12-10 | Discharge: 2011-12-11 | Disposition: A | Discharge status: Home / Self Care | Payer: Medicare Other | Attending: Emergency Medicine | Admitting: Emergency Medicine

## 2011-12-10 ENCOUNTER — Encounter (HOSPITAL_COMMUNITY): Payer: Self-pay

## 2011-12-10 DIAGNOSIS — R142 Eructation: Secondary | ICD-10-CM | POA: Insufficient documentation

## 2011-12-10 DIAGNOSIS — S22000A Wedge compression fracture of unspecified thoracic vertebra, initial encounter for closed fracture: Secondary | ICD-10-CM

## 2011-12-10 DIAGNOSIS — S22009A Unspecified fracture of unspecified thoracic vertebra, initial encounter for closed fracture: Secondary | ICD-10-CM | POA: Insufficient documentation

## 2011-12-10 DIAGNOSIS — F3130 Bipolar disorder, current episode depressed, mild or moderate severity, unspecified: Secondary | ICD-10-CM | POA: Insufficient documentation

## 2011-12-10 DIAGNOSIS — Z79899 Other long term (current) drug therapy: Secondary | ICD-10-CM | POA: Insufficient documentation

## 2011-12-10 DIAGNOSIS — X58XXXA Exposure to other specified factors, initial encounter: Secondary | ICD-10-CM | POA: Insufficient documentation

## 2011-12-10 DIAGNOSIS — K5900 Constipation, unspecified: Secondary | ICD-10-CM | POA: Insufficient documentation

## 2011-12-10 DIAGNOSIS — E785 Hyperlipidemia, unspecified: Secondary | ICD-10-CM | POA: Insufficient documentation

## 2011-12-10 DIAGNOSIS — R109 Unspecified abdominal pain: Secondary | ICD-10-CM | POA: Insufficient documentation

## 2011-12-10 DIAGNOSIS — R141 Gas pain: Secondary | ICD-10-CM | POA: Insufficient documentation

## 2011-12-10 HISTORY — DX: Hyperlipidemia, unspecified: E78.5

## 2011-12-10 HISTORY — DX: Bipolar disorder, unspecified: F31.9

## 2011-12-10 HISTORY — DX: Major depressive disorder, single episode, unspecified: F32.9

## 2011-12-10 HISTORY — DX: Disorder of kidney and ureter, unspecified: N28.9

## 2011-12-10 HISTORY — DX: Carpal tunnel syndrome, unspecified upper limb: G56.00

## 2011-12-10 HISTORY — DX: Fibromyalgia: M79.7

## 2011-12-10 HISTORY — DX: Depression, unspecified: F32.A

## 2011-12-10 LAB — CBC
MCV: 86.3 fL (ref 78.0–100.0)
Platelets: 309 10*3/uL (ref 150–400)
RBC: 3.95 MIL/uL (ref 3.87–5.11)
RDW: 12.3 % (ref 11.5–15.5)
WBC: 7.4 10*3/uL (ref 4.0–10.5)

## 2011-12-10 LAB — DIFFERENTIAL
Eosinophils Absolute: 0.1 10*3/uL (ref 0.0–0.7)
Lymphs Abs: 1.5 10*3/uL (ref 0.7–4.0)
Monocytes Absolute: 0.6 10*3/uL (ref 0.1–1.0)
Monocytes Relative: 8 % (ref 3–12)
Neutrophils Relative %: 69 % (ref 43–77)

## 2011-12-10 LAB — COMPREHENSIVE METABOLIC PANEL
Albumin: 3.7 g/dL (ref 3.5–5.2)
BUN: 10 mg/dL (ref 6–23)
Calcium: 9.3 mg/dL (ref 8.4–10.5)
GFR calc Af Amer: >90 mL/min (ref >90)
Glucose, Bld: 89 mg/dL (ref 70–99)
Sodium: 123 mEq/L — ABNORMAL LOW (ref 135–145)
Total Protein: 7.3 g/dL (ref 6.0–8.3)

## 2011-12-10 LAB — URINALYSIS, ROUTINE W REFLEX MICROSCOPIC
Glucose, UA: NEGATIVE mg/dL
Hgb urine dipstick: NEGATIVE
Ketones, ur: NEGATIVE mg/dL
Protein, ur: NEGATIVE mg/dL
Urobilinogen, UA: 0.2 mg/dL (ref 0.0–1.0)

## 2011-12-10 LAB — LIPASE, BLOOD: Lipase: 15 U/L (ref 11–59)

## 2011-12-10 MED ORDER — BISACODYL 10 MG RE SUPP: 10.0000 mg | RECTAL | Status: AC | PRN

## 2011-12-10 MED ORDER — ONDANSETRON HCL 4 MG/2ML IJ SOLN
4.0000 mg | Freq: Once | INTRAMUSCULAR | Status: DC
  Filled 2011-12-10: qty 2

## 2011-12-10 MED ORDER — FLEET ENEMA 7-19 GM/118ML RE ENEM
1.0000 | ENEMA | Freq: Once | RECTAL | Status: AC
  Administered 2011-12-10: 1 via RECTAL
  Filled 2011-12-10: qty 1

## 2011-12-10 MED ORDER — IOHEXOL 300 MG/ML  SOLN
100.0000 mL | Freq: Once | INTRAMUSCULAR | Status: AC | PRN
  Administered 2011-12-10: 100 mL via INTRAVENOUS

## 2011-12-10 MED ORDER — MAGNESIUM CITRATE PO SOLN: 148.0000 mL | Freq: Two times a day (BID) | ORAL | Status: AC

## 2011-12-10 MED ORDER — SODIUM CHLORIDE 0.9 % IV SOLN
1000.0000 mL | INTRAVENOUS | Status: DC
  Administered 2011-12-10: 1000 mL via INTRAVENOUS

## 2011-12-10 MED ORDER — SODIUM CHLORIDE 0.9 % IV SOLN
1000.0000 mL | Freq: Once | INTRAVENOUS | Status: AC
  Administered 2011-12-10: 1000 mL via INTRAVENOUS

## 2011-12-10 NOTE — ED Notes (Signed)
Pt states that she fell last week and is unable to have a bowl movement since then, she states that she's done about 6 enemas with no relief

## 2011-12-10 NOTE — ED Provider Notes (Signed)
History     CSN: [id number] Arrival date & time 12/10/11  1515 First MD Initiated Contact with Patient 12/10/11 1746     Chief Complaint  Patient presents with  . Constipation   HPI Pt presents to the ED with constipation.  She has had a recent t12 fracture and has been taking pain medications.  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  Pt has been trying laxatives without relief.  She has not had a BM in a week.  Pt denies fever.  No vomiting.  The pain in her abdomen is cramping and all over. Past Medical History  Diagnosis Date  . Fibromyalgia   . Bipolar 1 disorder   . Hyperlipemia   . Depression   . Carpal tunnel syndrome   . Renal disorder     History reviewed. No pertinent past surgical history.  History reviewed. No pertinent family history.  History  Substance Use Topics  . Smoking status: Not on file  . Smokeless tobacco: Not on file  . Alcohol Use: Yes    OB History    Grav Para Term Preterm Abortions TAB SAB Ect Mult Living                  Review of Systems  All other systems reviewed and are negative.    Allergies  Anaprox; Aspirin; Corticosteroids; Lyrica; and Robaxin  Home Medications   Current Outpatient Rx  Name Route Sig Dispense Refill  . AMITRIPTYLINE HCL 100 MG PO TABS Oral Take 100 mg by mouth at bedtime.    [name] BENZONATATE 100 MG PO CAPS Oral Take 100 mg by mouth 3 (three) times daily as needed. Cough    . CYCLOBENZAPRINE HCL 10 MG PO TABS Oral Take 10 mg by mouth 3 (three) times daily as needed. Spasms    . DIAZEPAM 5 MG PO TABS Oral Take 5 mg by mouth every 6 (six) hours as needed. Muscle spasms    . DULOXETINE HCL 60 MG PO CPEP Oral Take 60 mg by mouth daily.    . FENOFIBRATE 160 MG PO TABS Oral Take 160 mg by mouth daily.    . FUROSEMIDE 40 MG PO TABS Oral Take 40 mg by mouth daily.    [name] GABAPENTIN 600 MG PO TABS Oral Take 600 mg by mouth 3 (three) times daily.    [name] HYDROCHLOROTHIAZIDE 12.5 MG PO  CAPS Oral Take 12.5 mg by mouth daily.    [name] LAMOTRIGINE 150 MG PO TABS Oral Take 150 mg by mouth daily.    [name] LISINOPRIL 20 MG PO TABS Oral Take 20 mg by mouth 2 (two) times daily.    . MOMETASONE FUROATE 50 MCG/ACT NA SUSP Nasal Place 2 sprays into the nose daily.    . OXYCODONE-ACETAMINOPHEN 10-325 MG PO TABS Oral Take 1 tablet by mouth every 4 (four) hours as needed. Pain    . POTASSIUM CHLORIDE CRYS ER 10 MEQ PO TBCR Oral Take 10 mEq by mouth 2 (two) times daily.    [name] PRAVASTATIN SODIUM 20 MG PO TABS Oral Take 20 mg by mouth daily.    . SUMATRIPTAN 20 MG/ACT NA SOLN Nasal Place 1 spray into the nose every 2 (two) hours as needed. Migraine    . TEMAZEPAM 7.5 MG PO CAPS Oral Take 7.5 mg by mouth at bedtime as needed. Insomnia    . FENTANYL 75 MCG/HR TD PT72 Transdermal Place 1 patch onto the skin every 3 (three) days.  BP 162/74  Pulse 90  Temp 98.4 F (36.9 C) (Oral)  Resp 16  SpO2 98%  Physical Exam  Nursing note and vitals reviewed. Constitutional: She appears well-developed and well-nourished. No distress.  HENT:  Head: Normocephalic and atraumatic.  Right Ear: External ear normal.  Left Ear: External ear normal.  Eyes: Conjunctivae are normal. Right eye exhibits no discharge. Left eye exhibits no discharge. No scleral icterus.  Neck: Neck supple. No tracheal deviation present.  Cardiovascular: Normal rate, regular rhythm and intact distal pulses.   Pulmonary/Chest: Effort normal and breath sounds normal. No stridor. No respiratory distress. She has no wheezes. She has no rales.  Abdominal: Soft. Bowel sounds are normal. She exhibits distension. There is tenderness (mild diffusely). There is no rebound and no guarding.  Genitourinary:       No stool in rectal vault  Musculoskeletal: She exhibits no edema and no tenderness.       Lumbar ttp  Neurological: She is alert. She has normal strength. No sensory deficit. Cranial nerve deficit:  no gross defecits noted. She exhibits  normal muscle tone. She displays no seizure activity. Coordination normal.  Skin: Skin is warm and dry. No rash noted.  Psychiatric: She has a normal mood and affect.    ED Course  Procedures (including critical care time)  Labs Reviewed  CBC - Abnormal; Notable for the following:    Hemoglobin 11.9 (*)     HCT 34.1 (*)     All other components within normal limits  COMPREHENSIVE METABOLIC PANEL - Abnormal; Notable for the following:    Sodium 123 (*)     Potassium 3.1 (*)     Chloride 81 (*)     All other components within normal limits  URINALYSIS, ROUTINE W REFLEX MICROSCOPIC  DIFFERENTIAL  LIPASE, BLOOD   Ct Abdomen Pelvis W Contrast  12/10/2011  *RADIOLOGY REPORT*  Clinical Data: Fall.  Abdominal pain.  CT ABDOMEN AND PELVIS WITH CONTRAST  Technique:  Multidetector CT imaging of the abdomen and pelvis was performed following the standard protocol during bolus administration of intravenous contrast.  Contrast: OMNIPAQUE IOHEXOL 300 MG/ML  SOLN  Comparison: 11/02/2008  Findings: Liver, gallbladder, spleen, pancreas, left kidney are within normal limits.  Stable nodules in the left adrenal gland. Status post right nephrectomy.  Bladder is distended.  Uterus is absent.  Acute T12 superior endplate compression fracture is present.  There is 2 mm retropulsion of the superior endplate.  20% loss of height anteriorly.  Anterolisthesis L4 on L5 is present without pars defect.  Facet arthropathy is associated.  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  There is stranding and slight wall thickening associated with the lateral wall of the descending duodenum.  This extends superiorly towards the duodenal bulb.  This is in the location of the patient's nephrectomy and is probably related to postoperative changes.  Surgical clips are and an about this site.  At the left lung base, there is a partially imaged irregular patchy density.  The remainder of this abnormality was  not imaged.  IMPRESSION: Status post right nephrectomy with postoperative changes.  No acute intra-abdominal pathology.  Partially imaged patchy density at the left lung base.  Chest CT will be required to completely imaged this abnormality as lung mass is not excluded.  Acute T12 compression fracture as described.  Original Report Authenticated By: Donavan Burnet, M.D.   Dg Abd Acute W/chest  12/10/2011  *RADIOLOGY REPORT*  Clinical  Data: Constipation  ACUTE ABDOMEN SERIES (ABDOMEN 2 VIEW & CHEST 1 VIEW)  Comparison: 07/01/2010 chest radiograph  Findings: Dense opacity at the left lung base is noted.  The abnormality is primarily horizontally oriented at measures 1.3 x 5.2 cm.  There is associated linear atelectasis at both lung bases.  Nonspecific air fluid levels are present in the ascending colon. No disproportionate dilatation of small bowel.  No free intraperitoneal gas.  Radiopaque leads or wire projects over the right side the pelvis of unknown significance.  IMPRESSION: Abnormal opacity at the left lung base.  Chest CT is recommended to exclude mass.  Nonobstructive bowel gas pattern.  Nonspecific air fluid levels in the ascending colon.  Original Report Authenticated By: Donavan Burnet, M.D.     MDM  Pt does not have signs of obstipation, obstruction or other acute abdominal pathology on CT scan.  Her symptoms may be related to decreased motility and a component of constipation associated with her opiate use and t12 fracture.  Will given an enema and dc home on oral laxatives.       Celene Kras, MD 12/10/11 [phone]

## 2011-12-10 NOTE — ED Notes (Signed)
Patient transported to CT 

## 2011-12-10 NOTE — ED Notes (Signed)
Pt states that she has tried to give herself a high flow volume enema "like the one they use here in the ED" with no relief.

## 2011-12-10 NOTE — ED Notes (Signed)
Pt states that she fell last Friday and was seen by PCP and told she fractured T12. She usually takes 3 stool softer, but is now not able to go to bathroom. States that she is constipated, has done 6 enemas with no relief. 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 Patch on and also took oxycodone yesterday and today.

## 2012-01-29 ENCOUNTER — Other Ambulatory Visit: Payer: Self-pay | Admitting: Specialist

## 2012-01-29 DIAGNOSIS — M542 Cervicalgia: Secondary | ICD-10-CM

## 2012-02-09 ENCOUNTER — Other Ambulatory Visit: Payer: BC Managed Care – PPO

## 2012-02-19 ENCOUNTER — Other Ambulatory Visit: Payer: Self-pay | Admitting: Specialist

## 2012-02-19 DIAGNOSIS — M542 Cervicalgia: Secondary | ICD-10-CM

## 2012-03-02 ENCOUNTER — Inpatient Hospital Stay: Admission: RE | Admit: 2012-03-02 | Payer: BC Managed Care – PPO | Source: Ambulatory Visit

## 2012-03-02 ENCOUNTER — Other Ambulatory Visit: Payer: BC Managed Care – PPO

## 2012-03-17 ENCOUNTER — Inpatient Hospital Stay: Admission: RE | Admit: 2012-03-17 | Payer: BC Managed Care – PPO | Source: Ambulatory Visit

## 2012-07-27 ENCOUNTER — Other Ambulatory Visit: Payer: Self-pay | Admitting: Urology

## 2012-07-27 ENCOUNTER — Ambulatory Visit (HOSPITAL_COMMUNITY)
Admission: RE | Admit: 2012-07-27 | Discharge: 2012-07-27 | Disposition: A | Discharge status: Home / Self Care | Payer: Medicare Other | Source: Ambulatory Visit | Attending: Urology | Admitting: Urology

## 2012-07-27 DIAGNOSIS — C649 Malignant neoplasm of unspecified kidney, except renal pelvis: Secondary | ICD-10-CM | POA: Insufficient documentation

## 2012-10-14 ENCOUNTER — Other Ambulatory Visit (HOSPITAL_COMMUNITY): Payer: Self-pay | Admitting: Specialist

## 2012-10-14 DIAGNOSIS — M549 Dorsalgia, unspecified: Secondary | ICD-10-CM

## 2012-10-27 ENCOUNTER — Encounter (HOSPITAL_COMMUNITY): Payer: Medicare Other

## 2013-03-22 ENCOUNTER — Institutional Professional Consult (permissible substitution): Payer: BC Managed Care – PPO | Admitting: Internal Medicine

## 2013-03-30 ENCOUNTER — Ambulatory Visit: Payer: BC Managed Care – PPO | Admitting: Cardiology

## 2013-04-07 ENCOUNTER — Encounter: Payer: Self-pay | Admitting: Pulmonary Disease

## 2013-04-07 ENCOUNTER — Ambulatory Visit (INDEPENDENT_AMBULATORY_CARE_PROVIDER_SITE_OTHER): Payer: Medicare Other | Admitting: Pulmonary Disease

## 2013-04-07 ENCOUNTER — Encounter (INDEPENDENT_AMBULATORY_CARE_PROVIDER_SITE_OTHER): Payer: Self-pay

## 2013-04-07 VITALS — BP 130/82 | HR 92 | Temp 98.4°F | Ht 59.0 in | Wt 139.6 lb

## 2013-04-07 DIAGNOSIS — Z720 Tobacco use: Secondary | ICD-10-CM

## 2013-04-07 DIAGNOSIS — Z01811 Encounter for preprocedural respiratory examination: Secondary | ICD-10-CM

## 2013-04-07 DIAGNOSIS — F17200 Nicotine dependence, unspecified, uncomplicated: Secondary | ICD-10-CM

## 2013-04-07 NOTE — Progress Notes (Signed)
Subjective:    Patient ID: Theresa Pennington, female    DOB: [birth year], 62 y.o.   MRN: [id number]  HPI  This is a very pleasant 62 year old female who has smoked heavily over the years who comes to our clinic today for a perioperative pulmonary risk evaluation for an upcoming cervical spine procedure. She has no prior medical history significant for respiratory problem but has smoked 1-1/2 packs of cigarettes daily for least 37 years. She's had multiple spinal surgeries in the past all at [facility] and she is supposed to have one in her cervical spine within the next few weeks. She's been referred by her primary care physician for perioperative risk evaluation.  She tells me that she has not had trouble with shortness of breath, cough, or other respiratory complaints. She does have occasional chest congestion. She's had bronchitis one time in her life.  After her prior spinal procedure she did not have perioperative pulmonary complications that she's aware of. She says that she is always used the incisions from her regularly and has never had to stay in the hospital for x-ray amount of time because of pulmonary problems.  Unfortunately yesterday she fell and bruised her right rib. She was seen in the emergency department and [facility] and had a chest x-ray.  She was told that she didn't have a broken rib.  Past Medical History  Diagnosis Date  . Fibromyalgia   . Bipolar 1 disorder   . Hyperlipemia   . Depression   . Carpal tunnel syndrome   . Renal disorder   . Cancer     Right Kidney     Family History  Problem Relation Age of Onset  . Emphysema Father   . Heart disease Mother   . Heart disease Father   . Cancer Father   . High Cholesterol Mother   . Hypertension Mother   . Diabetes Mother      History   Social History  . Marital Status: Divorced    Spouse Name: N/A    Number of Children: N/A  . Years of Education: N/A   Occupational History  . Not  on file.   Social History Main Topics  . Smoking status: Current Every Day Smoker -- 1.50 packs/day for 37 years  . Smokeless tobacco: Not on file  . Alcohol Use: No  . Drug Use: No  . Sexual Activity: Not on file   Other Topics Concern  . Not on file   Social History Narrative  . No narrative on file     Allergies  Allergen Reactions  . Anaprox [Naproxen]   . Aspirin   . Corticosteroids   . Lyrica [Pregabalin]   . Robaxin [Methocarbamol]      Outpatient Prescriptions Prior to Visit  Medication Sig Dispense Refill  . amitriptyline (ELAVIL) 100 MG tablet Take 100 mg by mouth at bedtime.      . benzonatate (TESSALON) 100 MG capsule Take 100 mg by mouth 3 (three) times daily as needed. Cough      . cyclobenzaprine (FLEXERIL) 10 MG tablet Take 10 mg by mouth 3 (three) times daily as needed. Spasms      . diazepam (VALIUM) 5 MG tablet Take 5 mg by mouth every 6 (six) hours as needed. Muscle spasms      . DULoxetine (CYMBALTA) 60 MG capsule Take 60 mg by mouth daily.      . fenofibrate 160 MG tablet Take 160 mg by mouth daily.      [name]  fentaNYL (DURAGESIC - DOSED MCG/HR) 75 MCG/HR Place 1 patch onto the skin every 3 (three) days.      . furosemide (LASIX) 40 MG tablet Take 40 mg by mouth daily.      [name] gabapentin (NEURONTIN) 600 MG tablet Take 600 mg by mouth 3 (three) times daily.      . hydrochlorothiazide (MICROZIDE) 12.5 MG capsule Take 12.5 mg by mouth daily.      [name] lamoTRIgine (LAMICTAL) 150 MG tablet Take 150 mg by mouth daily.      [name] lisinopril (PRINIVIL,ZESTRIL) 20 MG tablet Take 20 mg by mouth 2 (two) times daily.      . mometasone (NASONEX) 50 MCG/ACT nasal spray Place 2 sprays into the nose daily.      [name] oxyCODONE-acetaminophen (PERCOCET) 10-325 MG per tablet Take 1 tablet by mouth every 4 (four) hours as needed. Pain      . potassium chloride (K-DUR,KLOR-CON) 10 MEQ tablet Take 10 mEq by mouth 2 (two) times daily.      . pravastatin (PRAVACHOL) 20 MG tablet Take 20 mg by  mouth daily.      . SUMAtriptan (IMITREX) 20 MG/ACT nasal spray Place 1 spray into the nose every 2 (two) hours as needed. Migraine      . temazepam (RESTORIL) 7.5 MG capsule Take 7.5 mg by mouth at bedtime as needed. Insomnia       No facility-administered medications prior to visit.      Review of Systems  Constitutional: Positive for fatigue. Negative for fever, chills and appetite change.  HENT: Positive for congestion and postnasal drip. Negative for rhinorrhea and sneezing.   Eyes: Negative for pain and redness.  Respiratory: Positive for cough. Negative for chest tightness and shortness of breath.   Cardiovascular: Negative for chest pain.  Gastrointestinal: Negative for vomiting, diarrhea and constipation.  Endocrine: Negative for heat intolerance.  Genitourinary: Negative for difficulty urinating and pelvic pain.  Musculoskeletal: Positive for back pain and neck pain.  Skin: Negative for color change.  Allergic/Immunologic: Negative for immunocompromised state.  Neurological: Positive for weakness and numbness. Negative for dizziness.  Hematological: Negative for adenopathy.  Psychiatric/Behavioral: Positive for sleep disturbance. Negative for behavioral problems, self-injury and dysphoric mood.       Objective:   Physical Exam Filed Vitals:   04/07/13 1015  BP: 130/82  Pulse: 92  Temp: 98.4 F (36.9 C)  TempSrc: Oral  Height: 4\' 11"  (1.499 m)  Weight: 139 lb 9.6 oz (63.322 kg)  SpO2: 96%  RA  Ambulated 500 feet and oxygen saturation did not desaturate significantly  Gen: chronically ill appearing, no acute distress HEENT: NCAT, PERRL, EOMi, OP clear, neck chronically flexed PULM: CTA B CV: RRR, no mgr, no JVD AB: BS+, soft, nontender, no hsm Ext: warm, no edema, no clubbing, no cyanosis Derm: no rash or skin breakdown Neuro: A&Ox4, CN II-XII intact, strength 5/5 in all 4 extremities   04/05/2013 CXR [facility] hospital) report > TECHNIQUE: PA chest and  rib x-rays. 3 views. Comparison study 01/03/2013.  FINDINGS: Cardiac size stable. Reticular densities both lungs. Healed right rib fractures. No acute rib fracture or pneumothorax identified. Postop changes cervical spine.     Assessment & Plan:   Preop pulmonary/respiratory exam I am encouraged by the fact that she has never been given a diagnosis of a pulmonary problem, her exam is normal,and her oxygenation is normal with exertion. Also, I am pleased that a chest x-ray performed yesterday was normal.  I will  not obtain spirometry today because she fell yesterday and bruised her right rib and so I think the results of that test would be skewed by this.  Overall, her risk of a perioperative pulmonary complication is very low considering the fact that she doesn't have clear evidence of any lung disease.  Plan: -Obtain PFT within 7 days -Proceed with surgery -Out of bed as soon as possible -Incentive spirometry round the time of surgery -Quit smoking, quit smoking, quit smoking.  Tobacco abuse We talked about this at length today. She currently is not willing to quit and she will try anything else. She's previously tried hypnosis, patches, and nicotine gum. She really does not want to quit smoking cigarettes.   Updated Medication List Outpatient Encounter Prescriptions as of 04/07/2013  Medication Sig  . alendronate (FOSAMAX) 70 MG tablet 70 mg.  . amitriptyline (ELAVIL) 100 MG tablet Take 100 mg by mouth at bedtime.  [name] azithromycin (ZITHROMAX) 250 MG tablet   . benzonatate (TESSALON) 100 MG capsule Take 100 mg by mouth 3 (three) times daily as needed. Cough  . cyclobenzaprine (FLEXERIL) 10 MG tablet Take 10 mg by mouth 3 (three) times daily as needed. Spasms  . diazepam (VALIUM) 5 MG tablet Take 5 mg by mouth every 6 (six) hours as needed. Muscle spasms  . DULoxetine (CYMBALTA) 60 MG capsule Take 60 mg by mouth daily.  . fenofibrate 160 MG tablet Take 160 mg by mouth daily.  . fentaNYL  (DURAGESIC - DOSED MCG/HR) 75 MCG/HR Place 1 patch onto the skin every 3 (three) days.  . furosemide (LASIX) 40 MG tablet Take 40 mg by mouth daily.  [name] gabapentin (NEURONTIN) 600 MG tablet Take 600 mg by mouth 3 (three) times daily.  . hydrochlorothiazide (MICROZIDE) 12.5 MG capsule Take 12.5 mg by mouth daily.  [name] lamoTRIgine (LAMICTAL) 150 MG tablet Take 150 mg by mouth daily.  [name] lisinopril (PRINIVIL,ZESTRIL) 20 MG tablet Take 20 mg by mouth 2 (two) times daily.  [name] LORazepam (ATIVAN) 0.5 MG tablet   . Meth-Hyo-M Bl-Na Phos-Ph Sal (URIBEL) 118 MG CAPS   . metoprolol (LOPRESSOR) 50 MG tablet   . mometasone (NASONEX) 50 MCG/ACT nasal spray Place 2 sprays into the nose daily.  . nitrofurantoin, macrocrystal-monohydrate, (MACROBID) 100 MG capsule   . nystatin (MYCOSTATIN) 100000 UNIT/ML suspension   . oxyCODONE-acetaminophen (PERCOCET) 10-325 MG per tablet Take 1 tablet by mouth every 4 (four) hours as needed. Pain  . pantoprazole (PROTONIX) 40 MG tablet 40 mg.  . polyethylene glycol powder (GLYCOLAX/MIRALAX) powder   . potassium chloride (K-DUR,KLOR-CON) 10 MEQ tablet Take 10 mEq by mouth 2 (two) times daily.  . potassium chloride (MICRO-K) 10 MEQ CR capsule 1 capsule.  . pravastatin (PRAVACHOL) 20 MG tablet Take 20 mg by mouth daily.  . risedronate (ACTONEL) 5 MG tablet 1 tablet.  . solifenacin (VESICARE) 5 MG tablet 5 mg.  . SUMAtriptan (IMITREX) 20 MG/ACT nasal spray Place 1 spray into the nose every 2 (two) hours as needed. Migraine  . temazepam (RESTORIL) 7.5 MG capsule Take 7.5 mg by mouth at bedtime as needed. Insomnia  . trimethoprim (TRIMPEX) 100 MG tablet 100 mg.  . VOLTAREN 1 % GEL   . [DISCONTINUED] diclofenac sodium (VOLTAREN) 1 % GEL   . [DISCONTINUED] diclofenac sodium (VOLTAREN) 1 % GEL   . [DISCONTINUED] LORazepam (ATIVAN) 0.5 MG tablet 0.5 mg.  . [DISCONTINUED] nystatin (MYCOSTATIN) 100000 UNIT/ML suspension   . [DISCONTINUED] polyethylene glycol powder (GLYCOLAX/MIRALAX)  powder   . [  DISCONTINUED] alendronate (FOSAMAX) 70 MG tablet Take 1 tablet by mouth daily. Take every seven days  . [DISCONTINUED] UNABLE TO FIND

## 2013-04-07 NOTE — Assessment & Plan Note (Addendum)
I am encouraged by the fact that she has never been given a diagnosis of a pulmonary problem, her exam is normal,and her oxygenation is normal with exertion. Also, I am pleased that a chest x-ray performed yesterday was normal.  I will not obtain spirometry today because she fell yesterday and bruised her right rib and so I think the results of that test would be skewed by this.  Overall, her risk of a perioperative pulmonary complication is very low considering the fact that she doesn't have clear evidence of any lung disease.  Plan: -Obtain PFT within 7 days -Proceed with surgery -Out of bed as soon as possible -Incentive spirometry round the time of surgery -Quit smoking, quit smoking, quit smoking.

## 2013-04-07 NOTE — Patient Instructions (Signed)
We will set up lung function testing for you next week As long as that test is OK, from my perspective you can proceed with surgery. Quit smoking  We will see you back as needed

## 2013-04-07 NOTE — Assessment & Plan Note (Signed)
We talked about this at length today. She currently is not willing to quit and she will try anything else. She's previously tried hypnosis, patches, and nicotine gum. She really does not want to quit smoking cigarettes.

## 2013-04-11 ENCOUNTER — Ambulatory Visit (HOSPITAL_COMMUNITY)
Admission: RE | Admit: 2013-04-11 | Discharge: 2013-04-11 | Disposition: A | Discharge status: Home / Self Care | Payer: Medicare Other | Source: Ambulatory Visit | Attending: Pulmonary Disease | Admitting: Pulmonary Disease

## 2013-04-11 DIAGNOSIS — R0609 Other forms of dyspnea: Secondary | ICD-10-CM | POA: Insufficient documentation

## 2013-04-11 DIAGNOSIS — R0989 Other specified symptoms and signs involving the circulatory and respiratory systems: Secondary | ICD-10-CM | POA: Insufficient documentation

## 2013-04-11 DIAGNOSIS — Z01811 Encounter for preprocedural respiratory examination: Secondary | ICD-10-CM | POA: Insufficient documentation

## 2013-04-11 DIAGNOSIS — F17200 Nicotine dependence, unspecified, uncomplicated: Secondary | ICD-10-CM | POA: Insufficient documentation

## 2013-04-11 MED ORDER — ALBUTEROL SULFATE (5 MG/ML) 0.5% IN NEBU
2.5000 mg | INHALATION_SOLUTION | Freq: Once | RESPIRATORY_TRACT | Status: AC
  Administered 2013-04-11: 2.5 mg via RESPIRATORY_TRACT

## 2013-04-12 ENCOUNTER — Encounter: Payer: Self-pay | Admitting: Cardiology

## 2013-04-12 ENCOUNTER — Ambulatory Visit (INDEPENDENT_AMBULATORY_CARE_PROVIDER_SITE_OTHER): Payer: Medicare Other | Admitting: Cardiology

## 2013-04-12 VITALS — BP 128/74 | HR 103 | Ht 59.0 in | Wt 133.0 lb

## 2013-04-12 DIAGNOSIS — R9431 Abnormal electrocardiogram [ECG] [EKG]: Secondary | ICD-10-CM

## 2013-04-12 NOTE — Progress Notes (Signed)
Patient ID: Theresa Pennington, female   DOB: 1951/02/11, 62 y.o.   MRN: [id number]     Patient Name: Theresa Pennington Date of Encounter: 04/12/2013  Primary Care Provider:  Default, Provider, MD Primary Cardiologist:  Tobias Alexander, H  Problem List   Past Medical History  Diagnosis Date  . Fibromyalgia   . Bipolar 1 disorder   . Hyperlipemia   . Depression   . Carpal tunnel syndrome   . Renal disorder   . Cancer     Right Kidney   Past Surgical History  Procedure Laterality Date  . Tonsillectomy    . Right kidney removed    . Right knee arthroscopy    . Abdominal hysterectomy    . Neck sx      X3  . Appendectomy     Allergies  Allergies  Allergen Reactions  . Anaprox [Naproxen]   . Aspirin   . Corticosteroids   . Lyrica [Pregabalin]   . Robaxin [Methocarbamol]     HPI  62 year old female with h/o hypertension, hyperlipidemia, renal cancer, ongoing smoker who is coming to our clinic for preoperative evaluation. The patient is trying to schedule a cervical spinal fusion. She has been followed in the past by a cardiologist because of her significant family history. The patient states that she has never had a heart attack. She denies any prior chest pain. She is limited with her activities due to her neck pain, but she is able to walk a block without shortness of breath. She denies orthopnea, PND, palpitation or syncope.   Home Medications  Prior to Admission medications   Medication Sig Start Date End Date Taking? Authorizing Provider  alendronate (FOSAMAX) 70 MG tablet 70 mg.    Historical Provider, MD  amitriptyline (ELAVIL) 100 MG tablet Take 100 mg by mouth at bedtime.    Historical Provider, MD  azithromycin (ZITHROMAX) 250 MG tablet     Historical Provider, MD  benzonatate (TESSALON) 100 MG capsule Take 100 mg by mouth 3 (three) times daily as needed. Cough    Historical Provider, MD  cyclobenzaprine (FLEXERIL) 10 MG tablet Take 10 mg by mouth 3 (three) times  daily as needed. Spasms    Historical Provider, MD  diazepam (VALIUM) 5 MG tablet Take 5 mg by mouth every 6 (six) hours as needed. Muscle spasms    Historical Provider, MD  DULoxetine (CYMBALTA) 60 MG capsule Take 60 mg by mouth daily.    Historical Provider, MD  fenofibrate 160 MG tablet Take 160 mg by mouth daily.    Historical Provider, MD  fentaNYL (DURAGESIC - DOSED MCG/HR) 75 MCG/HR Place 1 patch onto the skin every 3 (three) days.    Historical Provider, MD  furosemide (LASIX) 40 MG tablet Take 40 mg by mouth daily.    Historical Provider, MD  gabapentin (NEURONTIN) 600 MG tablet Take 600 mg by mouth 3 (three) times daily.    Historical Provider, MD  hydrochlorothiazide (MICROZIDE) 12.5 MG capsule Take 12.5 mg by mouth daily.    Historical Provider, MD  lamoTRIgine (LAMICTAL) 150 MG tablet Take 150 mg by mouth daily.    Historical Provider, MD  lisinopril (PRINIVIL,ZESTRIL) 20 MG tablet Take 20 mg by mouth 2 (two) times daily.    Historical Provider, MD  LORazepam (ATIVAN) 0.5 MG tablet     Historical Provider, MD  Meth-Hyo-M Bl-Na Phos-Ph Sal (URIBEL) 118 MG CAPS     Historical Provider, MD  metoprolol (LOPRESSOR) 50 MG  tablet  03/04/13   Historical Provider, MD  mometasone (NASONEX) 50 MCG/ACT nasal spray Place 2 sprays into the nose daily.    Historical Provider, MD  nitrofurantoin, macrocrystal-monohydrate, (MACROBID) 100 MG capsule     Historical Provider, MD  nystatin (MYCOSTATIN) 100000 UNIT/ML suspension     Historical Provider, MD  oxyCODONE-acetaminophen (PERCOCET) 10-325 MG per tablet Take 1 tablet by mouth every 4 (four) hours as needed. Pain    Historical Provider, MD  pantoprazole (PROTONIX) 40 MG tablet 40 mg.  01/06/14  Historical Provider, MD  polyethylene glycol powder (GLYCOLAX/MIRALAX) powder     Historical Provider, MD  potassium chloride (K-DUR,KLOR-CON) 10 MEQ tablet Take 10 mEq by mouth 2 (two) times daily.    Historical Provider, MD  potassium chloride (MICRO-K) 10  MEQ CR capsule 1 capsule.    Historical Provider, MD  pravastatin (PRAVACHOL) 20 MG tablet Take 20 mg by mouth daily.    Historical Provider, MD  risedronate (ACTONEL) 5 MG tablet 1 tablet.    Historical Provider, MD  solifenacin (VESICARE) 5 MG tablet 5 mg.  02/10/14  Historical Provider, MD  SUMAtriptan (IMITREX) 20 MG/ACT nasal spray Place 1 spray into the nose every 2 (two) hours as needed. Migraine    Historical Provider, MD  temazepam (RESTORIL) 7.5 MG capsule Take 7.5 mg by mouth at bedtime as needed. Insomnia    Historical Provider, MD  trimethoprim (TRIMPEX) 100 MG tablet 100 mg.    Historical Provider, MD  VOLTAREN 1 % GEL     Historical Provider, MD    Family History  Family History  Problem Relation Age of Onset  . Emphysema Father   . Heart disease Mother   . Heart disease Father   . Cancer Father   . High Cholesterol Mother   . Hypertension Mother   . Diabetes Mother     Social History  History   Social History  . Marital Status: Divorced    Spouse Name: N/A    Number of Children: N/A  . Years of Education: N/A   Occupational History  . Not on file.   Social History Main Topics  . Smoking status: Current Every Day Smoker -- 1.50 packs/day for 37 years  . Smokeless tobacco: Not on file  . Alcohol Use: No  . Drug Use: No  . Sexual Activity: Not on file   Other Topics Concern  . Not on file   Social History Narrative  . No narrative on file     Review of Systems, as per HPI, otherwise negative General:  No chills, fever, night sweats or weight changes.  Cardiovascular:  No chest pain, dyspnea on exertion, edema, orthopnea, palpitations, paroxysmal nocturnal dyspnea. Dermatological: No rash, lesions/masses Respiratory: No cough, dyspnea Urologic: No hematuria, dysuria Abdominal:   No nausea, vomiting, diarrhea, bright red blood per rectum, melena, or hematemesis Neurologic:  No visual changes, wkns, changes in mental status. All other systems  reviewed and are otherwise negative except as noted above.  Physical Exam BP 128/74, HR 103 General: Pleasant, NAD, older appearing Psych: Normal affect. Neuro: Alert and oriented X 3. Moves all extremities spontaneously. HEENT: Normal  Neck: Supple without bruits or JVD. Lungs:  Resp regular and unlabored, CTA. Heart: RRR no s3, s4, or murmurs. Abdomen: Soft, non-tender, non-distended, BS + x 4.  Extremities: No clubbing, cyanosis or edema. DP/PT/Radials 2+ and equal bilaterally.  Labs:  No results found for this basename: CKTOTAL, CKMB, TROPONINI,  in the last 72 hours  Lab Results  Component Value Date   WBC 7.4 12/10/2011   HGB 11.9* 12/10/2011   HCT 34.1* 12/10/2011   MCV 86.3 12/10/2011   PLT 309 12/10/2011   No results found for this basename: NA, K, CL, CO2, BUN, CREATININE, CALCIUM, LABALBU, PROT, BILITOT, ALKPHOS, ALT, AST, GLUCOSE,  in the last 168 hours No results found for this basename: CHOL, HDL, LDLCALC, TRIG   No results found for this basename: DDIMER   No components found with this basename: POCBNP,     Accessory Clinical Findings  ECG - Sinus tachycardia, 103 BPM, LAFB, poor R wave progression, possible anterior infarct, age undetermined   Assessment & Plan  62 year old female who is coming for a preoperative evaluation prior to the cervical spine surgery. The patient has no cardiac history and is asymptomatic. She can certainly achieve at least 4 METS and has no signs of angina or heart failure. She should continue taking metoprolol in the perioperative period.  Her ECG shows possible prior anterior infarct, we will order an echocardiogram to evaluate for wall motion abnormalities. This should not postpone her surgery.  Her hypertension is well controlled.  Lipids are followed and managed by her PCP. We will request the results including BMP.  Follow up as needed. We will call the patient with the results.  Lars Masson, MD, [facility] 04/12/2013,  9:48 AM

## 2013-04-12 NOTE — Patient Instructions (Addendum)
You have been cleared to have cervical spinal fusion with no contraindications from a cardiac stand point.  Your physician has requested that you have an echocardiogram. Echocardiography is a painless test that uses sound waves to create images of your heart. It provides your doctor with information about the size and shape of your heart and how well your heart's chambers and valves are working. This procedure takes approximately one hour. There are no restrictions for this procedure.  Your physician recommends that you continue on your current medications as directed. Please refer to the Current Medication list given to you today.  Your physician recommends that you schedule a follow-up appointment in: will be determined based on results of Echo.

## 2013-04-20 ENCOUNTER — Encounter: Payer: Self-pay | Admitting: Pulmonary Disease

## 2013-05-04 ENCOUNTER — Encounter: Payer: Self-pay | Admitting: *Deleted

## 2013-05-05 ENCOUNTER — Encounter: Payer: Self-pay | Admitting: Cardiology

## 2013-05-05 ENCOUNTER — Ambulatory Visit (HOSPITAL_COMMUNITY): Payer: Medicare Other | Attending: Cardiology | Admitting: Radiology

## 2013-05-05 DIAGNOSIS — R9431 Abnormal electrocardiogram [ECG] [EKG]: Secondary | ICD-10-CM | POA: Insufficient documentation

## 2013-05-05 DIAGNOSIS — I079 Rheumatic tricuspid valve disease, unspecified: Secondary | ICD-10-CM | POA: Insufficient documentation

## 2013-05-05 DIAGNOSIS — E785 Hyperlipidemia, unspecified: Secondary | ICD-10-CM | POA: Insufficient documentation

## 2013-05-05 DIAGNOSIS — F17200 Nicotine dependence, unspecified, uncomplicated: Secondary | ICD-10-CM | POA: Insufficient documentation

## 2013-05-05 NOTE — Progress Notes (Signed)
Echocardiogram performed.  

## 2013-05-06 NOTE — Progress Notes (Signed)
Patient ID: Theresa Pennington, female   DOB: 31-Mar-1951, 62 y.o.   MRN: [id number]  The echocardiogram showed normal left ventricular function and no wall motion abnormalities proving that there is no h/o prior myocardial infarction.  There is no contraindication for this patient to undergo her surgery.  Tobias Alexander, H 05/06/2013

## 2013-05-09 ENCOUNTER — Telehealth: Payer: Self-pay | Admitting: Cardiology

## 2013-05-09 NOTE — Telephone Encounter (Signed)
Pt given normal Echo results, she verbalized understanding. 

## 2013-05-09 NOTE — Telephone Encounter (Signed)
New Message  Pt returned call// ECHO results

## 2013-12-03 ENCOUNTER — Emergency Department (HOSPITAL_COMMUNITY)
Admission: EM | Admit: 2013-12-03 | Discharge: 2013-12-03 | Disposition: A | Discharge status: Home / Self Care | Payer: Medicare Other | Attending: Emergency Medicine | Admitting: Emergency Medicine

## 2013-12-03 ENCOUNTER — Emergency Department (HOSPITAL_COMMUNITY): Payer: Medicare Other

## 2013-12-03 ENCOUNTER — Encounter (HOSPITAL_COMMUNITY): Payer: Self-pay | Admitting: Emergency Medicine

## 2013-12-03 DIAGNOSIS — F319 Bipolar disorder, unspecified: Secondary | ICD-10-CM | POA: Insufficient documentation

## 2013-12-03 DIAGNOSIS — R296 Repeated falls: Secondary | ICD-10-CM | POA: Insufficient documentation

## 2013-12-03 DIAGNOSIS — Y9389 Activity, other specified: Secondary | ICD-10-CM | POA: Insufficient documentation

## 2013-12-03 DIAGNOSIS — Z79899 Other long term (current) drug therapy: Secondary | ICD-10-CM | POA: Insufficient documentation

## 2013-12-03 DIAGNOSIS — Y929 Unspecified place or not applicable: Secondary | ICD-10-CM | POA: Insufficient documentation

## 2013-12-03 DIAGNOSIS — S42009A Fracture of unspecified part of unspecified clavicle, initial encounter for closed fracture: Secondary | ICD-10-CM | POA: Insufficient documentation

## 2013-12-03 DIAGNOSIS — Z8739 Personal history of other diseases of the musculoskeletal system and connective tissue: Secondary | ICD-10-CM | POA: Insufficient documentation

## 2013-12-03 DIAGNOSIS — Z87448 Personal history of other diseases of urinary system: Secondary | ICD-10-CM | POA: Insufficient documentation

## 2013-12-03 DIAGNOSIS — Z792 Long term (current) use of antibiotics: Secondary | ICD-10-CM | POA: Insufficient documentation

## 2013-12-03 DIAGNOSIS — E785 Hyperlipidemia, unspecified: Secondary | ICD-10-CM | POA: Insufficient documentation

## 2013-12-03 DIAGNOSIS — F17200 Nicotine dependence, unspecified, uncomplicated: Secondary | ICD-10-CM | POA: Insufficient documentation

## 2013-12-03 DIAGNOSIS — S42002A Fracture of unspecified part of left clavicle, initial encounter for closed fracture: Secondary | ICD-10-CM

## 2013-12-03 DIAGNOSIS — Z85528 Personal history of other malignant neoplasm of kidney: Secondary | ICD-10-CM | POA: Insufficient documentation

## 2013-12-03 MED ORDER — HYDROCODONE-ACETAMINOPHEN 5-325 MG PO TABS: 1.0000 | ORAL_TABLET | Freq: Four times a day (QID) | ORAL | Status: AC | PRN

## 2013-12-03 NOTE — Discharge Instructions (Signed)
Return here as needed. Follow up with your orthopedist. °

## 2013-12-03 NOTE — ED Provider Notes (Signed)
Medical screening examination/treatment/procedure(s) were conducted as a shared visit with non-physician practitioner(s) and myself.  I personally evaluated the patient during the encounter.   EKG Interpretation None     Tender left clavicle area but not proximal humerus region  Babette Relic, MD 12/03/13 8003

## 2013-12-03 NOTE — ED Provider Notes (Signed)
CSN: [id number]     Arrival date & time 12/03/13  0906 History   First MD Initiated Contact with Patient 12/03/13 0915     Chief Complaint  Patient presents with  . Shoulder Pain     (Consider location/radiation/quality/duration/timing/severity/associated sxs/prior Treatment) HPI Patient presents to the emergency department with left shoulder pain.  The patient, states she was attempting to get out of bed.  When she stumbled and fell, hitting her left shoulder area.  She has pain to the left clavicle.  The patient, states, that it hurts more with range of motion and palpation.  The patient denies chest pain, shortness of breath, nausea, vomiting, headache, blurred vision, weakness, dizziness, back pain, neck pain, or syncope.  Patient, states, that her symptoms have been persistent.  Patient did not take any medications prior to arrival Past Medical History  Diagnosis Date  . Fibromyalgia   . Bipolar 1 disorder   . Hyperlipemia   . Depression   . Carpal tunnel syndrome   . Renal disorder   . Cancer     Right Kidney   Past Surgical History  Procedure Laterality Date  . Tonsillectomy    . Right kidney removed    . Right knee arthroscopy    . Abdominal hysterectomy    . Neck sx      X3  . Appendectomy     Family History  Problem Relation Age of Onset  . Emphysema Father   . Heart disease Mother   . Heart disease Father   . Cancer Father   . High Cholesterol Mother   . Hypertension Mother   . Diabetes Mother    History  Substance Use Topics  . Smoking status: Current Every Day Smoker -- 2.00 packs/day for 37 years    Types: Cigarettes  . Smokeless tobacco: Not on file  . Alcohol Use: No   OB History   Grav Para Term Preterm Abortions TAB SAB Ect Mult Living                 Review of Systems  All other systems negative except as documented in the HPI. All pertinent positives and negatives as reviewed in the HPI.  Allergies  Anaprox; Aspirin; Corticosteroids;  Lyrica; and Robaxin  Home Medications   Prior to Admission medications   Medication Sig Start Date End Date Taking? Authorizing Provider  alendronate (FOSAMAX) 70 MG tablet 70 mg.    Historical Provider, MD  azithromycin (ZITHROMAX) 250 MG tablet     Historical Provider, MD  benzonatate (TESSALON) 100 MG capsule Take 100 mg by mouth 3 (three) times daily as needed. Cough    Historical Provider, MD  cyclobenzaprine (FLEXERIL) 10 MG tablet Take 10 mg by mouth 3 (three) times daily as needed. Spasms    Historical Provider, MD  DULoxetine (CYMBALTA) 60 MG capsule Take 60 mg by mouth daily.    Historical Provider, MD  fenofibrate 160 MG tablet Take 160 mg by mouth daily.    Historical Provider, MD  fentaNYL (DURAGESIC - DOSED MCG/HR) 75 MCG/HR Place 1 patch onto the skin every 3 (three) days.    Historical Provider, MD  furosemide (LASIX) 40 MG tablet Take 40 mg by mouth daily.    Historical Provider, MD  gabapentin (NEURONTIN) 600 MG tablet Take 600 mg by mouth 3 (three) times daily.    Historical Provider, MD  hydrochlorothiazide (MICROZIDE) 12.5 MG capsule Take 25 mg by mouth daily.     Historical Provider, MD  lamoTRIgine (LAMICTAL) 150 MG tablet Take 150 mg by mouth daily.    Historical Provider, MD  lisinopril (PRINIVIL,ZESTRIL) 20 MG tablet Take 20 mg by mouth 2 (two) times daily.    Historical Provider, MD  LORazepam (ATIVAN) 0.5 MG tablet     Historical Provider, MD  Meth-Hyo-M Bl-Na Phos-Ph Sal (URIBEL) 118 MG CAPS     Historical Provider, MD  metoprolol (LOPRESSOR) 50 MG tablet as needed.  03/04/13   Historical Provider, MD  mometasone (NASONEX) 50 MCG/ACT nasal spray Place 2 sprays into the nose daily.    Historical Provider, MD  nitrofurantoin, macrocrystal-monohydrate, (MACROBID) 100 MG capsule     Historical Provider, MD  nystatin (MYCOSTATIN) 100000 UNIT/ML suspension Use as directed 5 mLs in the mouth or throat as directed.     Historical Provider, MD  oxyCODONE-acetaminophen  (PERCOCET) 10-325 MG per tablet Take 1 tablet by mouth every 4 (four) hours as needed. Pain    Historical Provider, MD  pantoprazole (PROTONIX) 40 MG tablet Take 40 mg by mouth daily.   01/06/14  Historical Provider, MD  polyethylene glycol powder (GLYCOLAX/MIRALAX) powder Take by mouth 2 (two) times daily.     Historical Provider, MD  potassium chloride (MICRO-K) 10 MEQ CR capsule 1 capsule.    Historical Provider, MD  pravastatin (PRAVACHOL) 20 MG tablet Take 20 mg by mouth daily.    Historical Provider, MD  risedronate (ACTONEL) 5 MG tablet 1 tablet.    Historical Provider, MD  solifenacin (VESICARE) 5 MG tablet 5 mg.  02/10/14  Historical Provider, MD  SUMAtriptan (IMITREX) 20 MG/ACT nasal spray Place 1 spray into the nose every 2 (two) hours as needed. Migraine    Historical Provider, MD  trimethoprim (TRIMPEX) 100 MG tablet Take 100 mg by mouth daily.     Historical Provider, MD  VOLTAREN 1 % GEL as directed.     Historical Provider, MD   BP 156/86  Pulse 109  Temp(Src) 99 F (37.2 C) (Oral)  Resp 16  SpO2 97% Physical Exam  Nursing note and vitals reviewed. Constitutional: She is oriented to person, place, and time. She appears well-developed and well-nourished. No distress.  HENT:  Head: Normocephalic and atraumatic.  Mouth/Throat: Oropharynx is clear and moist.  Eyes: Pupils are equal, round, and reactive to light.  Neck: Normal range of motion. Neck supple.  Cardiovascular: Normal rate, regular rhythm and normal heart sounds.  Exam reveals no gallop and no friction rub.   No murmur heard. Pulmonary/Chest: Effort normal and breath sounds normal. No respiratory distress.  Musculoskeletal:       Left shoulder: She exhibits tenderness, deformity and pain. She exhibits normal range of motion.       Arms: Neurological: She is alert and oriented to person, place, and time. She exhibits normal muscle tone. Coordination normal.  Skin: Skin is warm and dry. No erythema.    ED Course    Procedures (including critical care time) Labs Review Labs Reviewed - No data to display  Imaging Review Dg Shoulder Left  12/03/2013   CLINICAL DATA:  Left shoulder pain following a fall today.  EXAM: LEFT SHOULDER - 2+ VIEW  COMPARISON:  06/26/2008.  FINDINGS: Essentially nondisplaced oblique fracture through the distal clavicle. Gross of cyst formation in the lateral aspect of the humeral head at the greater tuberosity. No dislocation.  IMPRESSION: Essentially nondisplaced distal clavicle fracture.   Electronically Signed   By: Enrique Sack M.D.   On: 12/03/2013 10:18  Patient be placed in sling and referred to orthopedics.  Told to return here as needed.  Ice and heat to the area that is sore over her clavicle MDM       Brent General, PA-C 12/05/13 1612

## 2013-12-03 NOTE — ED Notes (Signed)
Patient transported to X-ray 

## 2013-12-03 NOTE — ED Notes (Signed)
Patient states she got out of bed this morning around #:30 am and stumbled and fell. She now C/O pain in the left shoulder area, patients shoulder with deformity noted and she states that this is new. Positive PMS limited ROM to the left arm. Patient rates 8/10 at this time.

## 2014-01-13 ENCOUNTER — Other Ambulatory Visit (HOSPITAL_COMMUNITY): Payer: Self-pay | Admitting: Specialist

## 2014-01-18 ENCOUNTER — Other Ambulatory Visit (HOSPITAL_COMMUNITY): Payer: Medicare Other

## 2014-01-18 ENCOUNTER — Encounter (HOSPITAL_COMMUNITY): Payer: Self-pay

## 2014-01-18 ENCOUNTER — Inpatient Hospital Stay (HOSPITAL_COMMUNITY)
Admission: RE | Admit: 2014-01-18 | Discharge: 2014-01-18 | Disposition: A | Discharge status: Home / Self Care | Payer: Medicare Other | Source: Ambulatory Visit

## 2014-01-18 NOTE — Pre-Procedure Instructions (Signed)
Theresa Pennington  01/18/2014   Your procedure is scheduled on:  01/23/14  Report to [facility] short stay admitting at 530 AM.  Call this number if you have problems the morning of surgery: [phone]   Remember:   Do not eat food or drink liquids after midnight.   Take these medicines the morning of surgery with A SIP OF WATER: cymbalta,fentanyl patch, gabapentin , pain med if needed,lamictal,metoprolol, uribel,protonix    Take all meds as ordered until day of surgery except as instructed below or per dr   Bridgette Habermann all herbel meds, nsaids (aleve,naproxen,advil,ibuprofen) including vitamins, aspirin   Do not wear jewelry, make-up or nail polish.  Do not wear lotions, powders, or perfumes. You may wear deodorant.  Do not shave 48 hours prior to surgery. Men may shave face and neck.  Do not bring valuables to the hospital.  [facility] is not responsible                  for any belongings or valuables.               Contacts, dentures or bridgework may not be worn into surgery.  Leave suitcase in the car. After surgery it may be brought to your room.  For patients admitted to the hospital, discharge time is determined by your                treatment team.               Patients discharged the day of surgery will not be allowed to drive  home.  Name and phone number of your driver:   Special Instructions:  Special Instructions: [place] - Preparing for Surgery  Before surgery, you can play an important role.  Because skin is not sterile, your skin needs to be as free of germs as possible.  You can reduce the number of germs on you skin by washing with CHG (chlorahexidine gluconate) soap before surgery.  CHG is an antiseptic cleaner which kills germs and bonds with the skin to continue killing germs even after washing.  Please DO NOT use if you have an allergy to CHG or antibacterial soaps.  If your skin becomes reddened/irritated stop using the CHG and inform your nurse when you arrive at  Short Stay.  Do not shave (including legs and underarms) for at least 48 hours prior to the first CHG shower.  You may shave your face.  Please follow these instructions carefully:   1.  Shower with CHG Soap the night before surgery and the morning of Surgery.  2.  If you choose to wash your hair, wash your hair first as usual with your normal shampoo.  3.  After you shampoo, rinse your hair and body thoroughly to remove the Shampoo.  4.  Use CHG as you would any other liquid soap.  You can apply chg directly  to the skin and wash gently with scrungie or a clean washcloth.  5.  Apply the CHG Soap to your body ONLY FROM THE NECK DOWN.  Do not use on open wounds or open sores.  Avoid contact with your eyes ears, mouth and genitals (private parts).  Wash genitals (private parts)       with your normal soap.  6.  Wash thoroughly, paying special attention to the area where your surgery will be performed.  7.  Thoroughly rinse your body with warm water from the neck down.  8.  DO NOT shower/wash with your normal soap after using and rinsing off the CHG Soap.  9.  Pat yourself dry with a clean towel.            10.  Wear clean pajamas.            11.  Place clean sheets on your bed the night of your first shower and do not sleep with pets.  Day of Surgery  Do not apply any lotions/deodorants the morning of surgery.  Please wear clean clothes to the hospital/surgery center.   Please read over the following fact sheets that you were given: Pain Booklet, Coughing and Deep Breathing, Blood Transfusion Information, MRSA Information and Surgical Site Infection Prevention

## 2014-01-23 ENCOUNTER — Encounter (HOSPITAL_COMMUNITY): Admission: RE | Payer: Self-pay | Source: Ambulatory Visit

## 2014-01-23 ENCOUNTER — Inpatient Hospital Stay (HOSPITAL_COMMUNITY): Admission: RE | Admit: 2014-01-23 | Payer: Medicare Other | Source: Ambulatory Visit | Admitting: Specialist

## 2014-01-23 SURGERY — FUSION, SPINE, LUMBAR, MINIMALLY INVASIVE
Anesthesia: General

## 2014-03-17 ENCOUNTER — Other Ambulatory Visit: Payer: Self-pay

## 2014-03-30 ENCOUNTER — Other Ambulatory Visit (HOSPITAL_COMMUNITY): Payer: Self-pay | Admitting: Specialist

## 2014-04-19 ENCOUNTER — Other Ambulatory Visit (HOSPITAL_COMMUNITY): Payer: Medicare Other

## 2014-04-25 ENCOUNTER — Inpatient Hospital Stay (HOSPITAL_COMMUNITY): Admission: RE | Admit: 2014-04-25 | Payer: Medicare Other | Source: Ambulatory Visit | Admitting: Specialist

## 2014-04-25 ENCOUNTER — Encounter (HOSPITAL_COMMUNITY): Admission: RE | Payer: Self-pay | Source: Ambulatory Visit

## 2014-04-25 SURGERY — FUSION, SPINE, LUMBAR, MINIMALLY INVASIVE
Anesthesia: General

## 2014-07-14 ENCOUNTER — Other Ambulatory Visit: Payer: Self-pay | Admitting: Specialist

## 2014-07-14 DIAGNOSIS — M542 Cervicalgia: Secondary | ICD-10-CM

## 2014-07-14 DIAGNOSIS — M545 Low back pain: Secondary | ICD-10-CM

## 2014-07-14 DIAGNOSIS — M4804 Spinal stenosis, thoracic region: Secondary | ICD-10-CM

## 2014-08-11 ENCOUNTER — Encounter (HOSPITAL_COMMUNITY)
Admission: RE | Admit: 2014-08-11 | Discharge: 2014-08-11 | Disposition: A | Discharge status: Home / Self Care | Payer: Medicare Other | Source: Ambulatory Visit | Attending: Specialist | Admitting: Specialist

## 2014-08-11 ENCOUNTER — Encounter (HOSPITAL_COMMUNITY): Payer: Self-pay

## 2014-08-11 DIAGNOSIS — I444 Left anterior fascicular block: Secondary | ICD-10-CM | POA: Diagnosis not present

## 2014-08-11 DIAGNOSIS — K219 Gastro-esophageal reflux disease without esophagitis: Secondary | ICD-10-CM | POA: Diagnosis not present

## 2014-08-11 DIAGNOSIS — M545 Low back pain: Secondary | ICD-10-CM | POA: Insufficient documentation

## 2014-08-11 DIAGNOSIS — E785 Hyperlipidemia, unspecified: Secondary | ICD-10-CM | POA: Insufficient documentation

## 2014-08-11 DIAGNOSIS — I10 Essential (primary) hypertension: Secondary | ICD-10-CM | POA: Diagnosis not present

## 2014-08-11 DIAGNOSIS — Z01812 Encounter for preprocedural laboratory examination: Secondary | ICD-10-CM | POA: Diagnosis not present

## 2014-08-11 DIAGNOSIS — Z01818 Encounter for other preprocedural examination: Secondary | ICD-10-CM | POA: Diagnosis present

## 2014-08-11 DIAGNOSIS — F17200 Nicotine dependence, unspecified, uncomplicated: Secondary | ICD-10-CM | POA: Diagnosis not present

## 2014-08-11 DIAGNOSIS — R000 Tachycardia, unspecified: Secondary | ICD-10-CM | POA: Insufficient documentation

## 2014-08-11 DIAGNOSIS — J45909 Unspecified asthma, uncomplicated: Secondary | ICD-10-CM | POA: Diagnosis not present

## 2014-08-11 HISTORY — DX: Headache: R51

## 2014-08-11 HISTORY — DX: Unspecified asthma, uncomplicated: J45.909

## 2014-08-11 HISTORY — DX: Gastro-esophageal reflux disease without esophagitis: K21.9

## 2014-08-11 HISTORY — DX: Essential (primary) hypertension: I10

## 2014-08-11 HISTORY — DX: Headache, unspecified: R51.9

## 2014-08-11 LAB — BASIC METABOLIC PANEL
ANION GAP: 12 (ref 5–15)
BUN: 14 mg/dL (ref 6–23)
CO2: 27 mmol/L (ref 19–32)
CREATININE: 1.01 mg/dL (ref 0.50–1.10)
Calcium: 9.8 mg/dL (ref 8.4–10.5)
Chloride: 97 mmol/L (ref 96–112)
GFR calc Af Amer: 66 mL/min — ABNORMAL LOW (ref >90)
GFR calc non Af Amer: 57 mL/min — ABNORMAL LOW (ref >90)
Glucose, Bld: 105 mg/dL — ABNORMAL HIGH (ref 70–99)
POTASSIUM: 3.8 mmol/L (ref 3.5–5.1)
Sodium: 136 mmol/L (ref 135–145)

## 2014-08-11 LAB — CBC
HCT: 39.1 % (ref 36.0–46.0)
Hemoglobin: 12.6 g/dL (ref 12.0–15.0)
MCH: 30.6 pg (ref 26.0–34.0)
MCHC: 32.2 g/dL (ref 30.0–36.0)
MCV: 94.9 fL (ref 78.0–100.0)
PLATELETS: 358 10*3/uL (ref 150–400)
RBC: 4.12 MIL/uL (ref 3.87–5.11)
RDW: 13.8 % (ref 11.5–15.5)
WBC: 8.2 10*3/uL (ref 4.0–10.5)

## 2014-08-11 NOTE — Progress Notes (Signed)
Anesthesia Chart Review:  Pt is 65 year old female scheduled for thoracic and lumbar MRI on 08/15/2014.   PMH includes: HTN, hyperlipidemia, asthma, GERD, bipolar disorder, renal cancer. Current smoker. BMI 25.6  Preoperative labs reviewed.    EKG: Sinus tachycardia (102 bpm). Left anterior fascicular block. Cannot rule out Inferior infarct (masked by fascicular block?), age undetermined. Anterolateral infarct, age undetermined. Appears stable when compared with tracing dated 04/12/2013.   Echo 05/05/2013: - Left ventricle: The cavity size was normal. Wall thickness was normal. Systolic function was normal. The estimated ejection fraction was in the range of 60% to 65%. Wall motion was normal; there were no regional wall motion abnormalities. Doppler parameters are consistent with abnormal left ventricular relaxation (grade 1 diastolic dysfunction). - Aortic valve: Valve area: 2.95cm^2(VTI). Valve area: 3.04cm^2 (Vmax). - Atrial septum: No defect or patent foramen ovale was identified.  Called and spoke with Reagan in Dr. Otho Ket office to let her know we do not have an H&P on file for pt and one needs to be done within last 30 days or pt will not be able to have MRI as scheduled.   EKG appears stable, and old EKG from 2014 was performed by Dr. Ena Dawley in the context of a pre-operative clearance visit.  Dr. Meda Coffee cleared pt for cervical spine surgery based on this EKG. Given this, and current procedure is MRI, I anticipate pt can proceed as scheduled.   Willeen Cass, FNP-BC [facility] Short Stay Surgical Center/Anesthesiology Phone: [phone] 08/11/2014 5:15 PM

## 2014-08-11 NOTE — Progress Notes (Signed)
Dr Louanne Skye office called re: hx and phy.  Spoke with [employer] . Per allison zelenak dr Louanne Skye needs to update hx before procedure date 08/15/14

## 2014-08-11 NOTE — Pre-Procedure Instructions (Addendum)
Theresa Pennington  08/11/2014   Your procedure is scheduled on:  08/15/14  Report to [facility] short stay admitting at 615 AM.  Call this number if you have problems the morning of surgery: [phone]   Remember:   Do not eat food or drink liquids after midnight.   Take these medicines the morning of surgery with A SIP OF WATER: metoprolol,cymbalta, gabapentin, pain patch, pain pill if needed,lamictal      STOP all herbel meds, nsaids (aleve,naproxen,advil,ibuprofen) starting now including vitamins ,aspirin   Do not wear jewelry, make-up or nail polish.  Do not wear lotions, powders, or perfumes. You may wear deodorant.  Do not shave 48 hours prior to surgery. Men may shave face and neck.  Do not bring valuables to the hospital.  [facility] is not responsible                  for any belongings or valuables.               Contacts, dentures or bridgework may not be worn into surgery.  Leave suitcase in the car. After surgery it may be brought to your room.  For patients admitted to the hospital, discharge time is determined by your                treatment team.               Patients discharged the day of surgery will not be allowed to drive  home.  Name and phone number of your driver:   Special Instructions:  Special Instructions: [place] - Preparing for Surgery  Before surgery, you can play an important role.  Because skin is not sterile, your skin needs to be as free of germs as possible.  You can reduce the number of germs on you skin by washing with CHG (chlorahexidine gluconate) soap before surgery.  CHG is an antiseptic cleaner which kills germs and bonds with the skin to continue killing germs even after washing.  Please DO NOT use if you have an allergy to CHG or antibacterial soaps.  If your skin becomes reddened/irritated stop using the CHG and inform your nurse when you arrive at Short Stay.  Do not shave (including legs and underarms) for at least 48 hours prior to  the first CHG shower.  You may shave your face.  Please follow these instructions carefully:   1.  Shower with CHG Soap the night before surgery and the morning of Surgery.  2.  If you choose to wash your hair, wash your hair first as usual with your normal shampoo.  3.  After you shampoo, rinse your hair and body thoroughly to remove the Shampoo.  4.  Use CHG as you would any other liquid soap.  You can apply chg directly  to the skin and wash gently with scrungie or a clean washcloth.  5.  Apply the CHG Soap to your body ONLY FROM THE NECK DOWN.  Do not use on open wounds or open sores.  Avoid contact with your eyes ears, mouth and genitals (private parts).  Wash genitals (private parts)       with your normal soap.  6.  Wash thoroughly, paying special attention to the area where your surgery will be performed.  7.  Thoroughly rinse your body with warm water from the neck down.  8.  DO NOT shower/wash with your normal soap after using and rinsing off the CHG Soap.  9.  Pat yourself dry with a clean towel.            10.  Wear clean pajamas.            11.  Place clean sheets on your bed the night of your first shower and do not sleep with pets.  Day of Surgery  Do not apply any lotions/deodorants the morning of surgery.  Please wear clean clothes to the hospital/surgery center.   Please read over the following fact sheets that you were given: Pain Booklet, Coughing and Deep Breathing and Surgical Site Infection Prevention

## 2014-08-11 NOTE — Progress Notes (Signed)
error 

## 2014-08-15 ENCOUNTER — Ambulatory Visit (HOSPITAL_COMMUNITY): Admission: RE | Admit: 2014-08-15 | Payer: Medicare Other | Source: Ambulatory Visit | Admitting: Specialist

## 2014-08-15 ENCOUNTER — Ambulatory Visit (HOSPITAL_COMMUNITY): Payer: Medicare Other | Admitting: Vascular Surgery

## 2014-08-15 ENCOUNTER — Ambulatory Visit (HOSPITAL_COMMUNITY)
Admission: RE | Admit: 2014-08-15 | Discharge: 2014-08-15 | Disposition: A | Discharge status: Home / Self Care | Payer: Medicare Other | Source: Ambulatory Visit | Attending: Specialist | Admitting: Specialist

## 2014-08-15 ENCOUNTER — Encounter (HOSPITAL_COMMUNITY)
Admission: RE | Disposition: A | Discharge status: Home / Self Care | Payer: Medicare Other | Source: Ambulatory Visit | Attending: Specialist

## 2014-08-15 ENCOUNTER — Ambulatory Visit (HOSPITAL_COMMUNITY): Payer: Medicare Other | Admitting: Anesthesiology

## 2014-08-15 ENCOUNTER — Encounter (HOSPITAL_COMMUNITY): Payer: Self-pay | Admitting: *Deleted

## 2014-08-15 DIAGNOSIS — Z9181 History of falling: Secondary | ICD-10-CM | POA: Insufficient documentation

## 2014-08-15 DIAGNOSIS — M4804 Spinal stenosis, thoracic region: Secondary | ICD-10-CM

## 2014-08-15 DIAGNOSIS — Z981 Arthrodesis status: Secondary | ICD-10-CM | POA: Diagnosis not present

## 2014-08-15 DIAGNOSIS — M40202 Unspecified kyphosis, cervical region: Secondary | ICD-10-CM | POA: Diagnosis not present

## 2014-08-15 DIAGNOSIS — M4310 Spondylolisthesis, site unspecified: Secondary | ICD-10-CM | POA: Insufficient documentation

## 2014-08-15 DIAGNOSIS — M4316 Spondylolisthesis, lumbar region: Secondary | ICD-10-CM | POA: Insufficient documentation

## 2014-08-15 DIAGNOSIS — M4802 Spinal stenosis, cervical region: Secondary | ICD-10-CM | POA: Diagnosis not present

## 2014-08-15 DIAGNOSIS — M545 Low back pain: Secondary | ICD-10-CM

## 2014-08-15 DIAGNOSIS — M47896 Other spondylosis, lumbar region: Secondary | ICD-10-CM | POA: Insufficient documentation

## 2014-08-15 DIAGNOSIS — M5124 Other intervertebral disc displacement, thoracic region: Secondary | ICD-10-CM | POA: Insufficient documentation

## 2014-08-15 DIAGNOSIS — M129 Arthropathy, unspecified: Secondary | ICD-10-CM | POA: Diagnosis not present

## 2014-08-15 DIAGNOSIS — M542 Cervicalgia: Secondary | ICD-10-CM | POA: Diagnosis present

## 2014-08-15 HISTORY — PX: RADIOLOGY WITH ANESTHESIA: SHX6223

## 2014-08-15 SURGERY — RADIOLOGY WITH ANESTHESIA
Anesthesia: General

## 2014-08-15 NOTE — Progress Notes (Signed)
Patient expressed need for pain medication.  No pacu orders.  This was relayed to Dr. Tresa Moore, who spoke with the patient.  No New Orders

## 2014-08-15 NOTE — Anesthesia Postprocedure Evaluation (Signed)
  Anesthesia Post-op Note  Patient: Theresa Pennington  Procedure(s) Performed: Procedure(s): MRI thoracic and lumbar (N/A)  Patient Location: PACU  Anesthesia Type:General  Level of Consciousness: awake  Airway and Oxygen Therapy: Patient Spontanous Breathing and Patient connected to nasal cannula oxygen  Post-op Pain: none  Post-op Assessment: Post-op Vital signs reviewed and Patient's Cardiovascular Status Stable  Post-op Vital Signs: Reviewed and stable  Last Vitals:  Filed Vitals:   08/15/14 1133  BP: 138/54  Pulse: 81  Temp:   Resp: 15    Complications: No apparent anesthesia complications

## 2014-08-15 NOTE — Transfer of Care (Signed)
Immediate Anesthesia Transfer of Care Note  Patient: Theresa Pennington  Procedure(s) Performed: Procedure(s): MRI thoracic and lumbar (N/A)  Patient Location: PACU  Anesthesia Type:General  Level of Consciousness: awake, oriented, patient cooperative and responds to stimulation  Airway & Oxygen Therapy: Patient Spontanous Breathing  Post-op Assessment: Report given to RN, Post -op Vital signs reviewed and stable, Patient moving all extremities and Patient moving all extremities X 4  Post vital signs: Reviewed and stable  Last Vitals:  Filed Vitals:   08/15/14 1200  BP:   Pulse: 75  Temp:   Resp: 13    Complications: No apparent anesthesia complications

## 2014-08-15 NOTE — Discharge Instructions (Signed)
°What to eat: ° °For your first meals, you should eat lightly; only small meals initially.  If you do not have nausea, you may eat larger meals.  Avoid spicy, greasy and heavy food.   ° °General Anesthesia, Adult, Care After  °Refer to this sheet in the next few weeks. These instructions provide you with information on caring for yourself after your procedure. Your health care provider may also give you more specific instructions. Your treatment has been planned according to current medical practices, but problems sometimes occur. Call your health care provider if you have any problems or questions after your procedure.  °WHAT TO EXPECT AFTER THE PROCEDURE  °After the procedure, it is typical to experience:  °Sleepiness.  °Nausea and vomiting. °HOME CARE INSTRUCTIONS  °For the first 24 hours after general anesthesia:  °Have a responsible person with you.  °Do not drive a car. If you are alone, do not take public transportation.  °Do not drink alcohol.  °Do not take medicine that has not been prescribed by your health care provider.  °Do not sign important papers or make important decisions.  °You may resume a normal diet and activities as directed by your health care provider.  °Change bandages (dressings) as directed.  °If you have questions or problems that seem related to general anesthesia, call the hospital and ask for the anesthetist or anesthesiologist on call. °SEEK MEDICAL CARE IF:  °You have nausea and vomiting that continue the day after anesthesia.  °You develop a rash. °SEEK IMMEDIATE MEDICAL CARE IF:  °You have difficulty breathing.  °You have chest pain.  °You have any allergic problems. °Document Released: 08/25/2000 Document Revised: 01/19/2013 Document Reviewed: 12/02/2012  °ExitCare® Patient Information ©2014 [employer].  ° °Sore Throat  ° ° °A sore throat is a painful, burning, sore, or scratchy feeling of the throat. There may be pain or tenderness when swallowing or talking. You may have  other symptoms with a sore throat. These include coughing, sneezing, fever, or a swollen neck. A sore throat is often the first sign of another sickness. These sicknesses may include a cold, flu, strep throat, or an infection called mono. Most sore throats go away without medical treatment.  °HOME CARE  °Only take medicine as told by your doctor.  °Drink enough fluids to keep your pee (urine) clear or pale yellow.  °Rest as needed.  °Try using throat sprays, lozenges, or suck on hard candy (if older than 4 years or as told).  °Sip warm liquids, such as broth, herbal tea, or warm water with honey. Try sucking on frozen ice pops or drinking cold liquids.  °Rinse the mouth (gargle) with salt water. Mix 1 teaspoon salt with 8 ounces of water.  °Do not smoke. Avoid being around others when they are smoking.  °Put a humidifier in your bedroom at night to moisten the air. You can also turn on a hot shower and sit in the bathroom for 5-10 minutes. Be sure the bathroom door is closed. °GET HELP RIGHT AWAY IF:  °You have trouble breathing.  °You cannot swallow fluids, soft foods, or your spit (saliva).  °You have more puffiness (swelling) in the throat.  °Your sore throat does not get better in 7 days.  °You feel sick to your stomach (nauseous) and throw up (vomit).  °You have a fever or lasting symptoms for more than 2-3 days.  °You have a fever and your symptoms suddenly get worse. °MAKE SURE YOU:  °Understand these   instructions.  °Will watch your condition.  °Will get help right away if you are not doing well or get worse. °Document Released: 02/26/2008 Document Revised: 02/11/2012 Document Reviewed: 01/25/2012  °ExitCare® Patient Information ©2015 [employer]. This information is not intended to replace advice given to you by your health care provider. Make sure you discuss any questions you have with your health care provider.  ° ° ° °

## 2014-08-15 NOTE — Anesthesia Preprocedure Evaluation (Addendum)
Anesthesia Evaluation  Patient identified by MRN, date of birth, ID band Patient awake    Reviewed: Allergy & Precautions, NPO status , Patient's Chart, lab work & pertinent test results  Airway Mallampati: III   Neck ROM: Limited    Dental  (+) Edentulous Upper, Edentulous Lower   Pulmonary Current Smoker (70+ pack year),  breath sounds clear to auscultation        Cardiovascular hypertension, Pt. on medications Rhythm:Regular  EF 65% 2014   Neuro/Psych Depression Bipolar Disorder    GI/Hepatic GERD-  Medicated,  Endo/Other    Renal/GU      Musculoskeletal   Abdominal (+)  Abdomen: soft.    Peds  Hematology   Anesthesia Other Findings   Reproductive/Obstetrics                            Anesthesia Physical Anesthesia Plan  ASA: III  Anesthesia Plan: General   Post-op Pain Management:    Induction: Intravenous  Airway Management Planned: Oral ETT  Additional Equipment:   Intra-op Plan:   Post-operative Plan: Extubation in OR  Informed Consent: I have reviewed the patients History and Physical, chart, labs and discussed the procedure including the risks, benefits and alternatives for the proposed anesthesia with the patient or authorized representative who has indicated his/her understanding and acceptance.     Plan Discussed with:   Anesthesia Plan Comments:         Anesthesia Quick Evaluation

## 2014-08-16 ENCOUNTER — Encounter (HOSPITAL_COMMUNITY): Payer: Self-pay | Admitting: Radiology

## 2014-08-21 MED FILL — Lidocaine HCl IV Inj 20 MG/ML: INTRAVENOUS | Qty: 5 | Status: AC

## 2014-08-21 MED FILL — Propofol IV Emul 10 MG/ML: INTRAVENOUS | Qty: 20 | Status: AC

## 2014-08-21 MED FILL — Ephedrine Sulfate Inj 50 MG/ML: INTRAMUSCULAR | Qty: 2 | Status: AC

## 2014-08-21 MED FILL — Ondansetron HCl Inj 4 MG/2ML (2 MG/ML): INTRAMUSCULAR | Qty: 2 | Status: AC

## 2014-09-02 ENCOUNTER — Inpatient Hospital Stay (HOSPITAL_COMMUNITY): Payer: Medicare Other

## 2014-09-02 ENCOUNTER — Inpatient Hospital Stay (HOSPITAL_COMMUNITY)
Admission: EM | Admit: 2014-09-02 | Discharge: 2014-09-03 | DRG: 552 | Payer: Medicare Other | Source: Other Acute Inpatient Hospital | Attending: Internal Medicine | Admitting: Internal Medicine

## 2014-09-02 ENCOUNTER — Other Ambulatory Visit: Payer: Self-pay

## 2014-09-02 DIAGNOSIS — Z9049 Acquired absence of other specified parts of digestive tract: Secondary | ICD-10-CM | POA: Diagnosis present

## 2014-09-02 DIAGNOSIS — M542 Cervicalgia: Secondary | ICD-10-CM | POA: Diagnosis present

## 2014-09-02 DIAGNOSIS — I10 Essential (primary) hypertension: Secondary | ICD-10-CM | POA: Diagnosis present

## 2014-09-02 DIAGNOSIS — E785 Hyperlipidemia, unspecified: Secondary | ICD-10-CM | POA: Diagnosis present

## 2014-09-02 DIAGNOSIS — J449 Chronic obstructive pulmonary disease, unspecified: Secondary | ICD-10-CM | POA: Diagnosis present

## 2014-09-02 DIAGNOSIS — J45909 Unspecified asthma, uncomplicated: Secondary | ICD-10-CM | POA: Diagnosis present

## 2014-09-02 DIAGNOSIS — K219 Gastro-esophageal reflux disease without esophagitis: Secondary | ICD-10-CM | POA: Diagnosis present

## 2014-09-02 DIAGNOSIS — M797 Fibromyalgia: Secondary | ICD-10-CM | POA: Diagnosis present

## 2014-09-02 DIAGNOSIS — G43909 Migraine, unspecified, not intractable, without status migrainosus: Secondary | ICD-10-CM | POA: Diagnosis present

## 2014-09-02 DIAGNOSIS — Z981 Arthrodesis status: Secondary | ICD-10-CM | POA: Diagnosis not present

## 2014-09-02 DIAGNOSIS — R0902 Hypoxemia: Secondary | ICD-10-CM

## 2014-09-02 DIAGNOSIS — F319 Bipolar disorder, unspecified: Secondary | ICD-10-CM | POA: Diagnosis present

## 2014-09-02 DIAGNOSIS — Z888 Allergy status to other drugs, medicaments and biological substances status: Secondary | ICD-10-CM

## 2014-09-02 DIAGNOSIS — Z90710 Acquired absence of both cervix and uterus: Secondary | ICD-10-CM

## 2014-09-02 DIAGNOSIS — W19XXXA Unspecified fall, initial encounter: Secondary | ICD-10-CM | POA: Diagnosis present

## 2014-09-02 DIAGNOSIS — F17210 Nicotine dependence, cigarettes, uncomplicated: Secondary | ICD-10-CM | POA: Diagnosis present

## 2014-09-02 DIAGNOSIS — Z720 Tobacco use: Secondary | ICD-10-CM | POA: Diagnosis not present

## 2014-09-02 DIAGNOSIS — N289 Disorder of kidney and ureter, unspecified: Secondary | ICD-10-CM | POA: Diagnosis present

## 2014-09-02 DIAGNOSIS — S12100A Unspecified displaced fracture of second cervical vertebra, initial encounter for closed fracture: Secondary | ICD-10-CM | POA: Diagnosis not present

## 2014-09-02 DIAGNOSIS — Y92009 Unspecified place in unspecified non-institutional (private) residence as the place of occurrence of the external cause: Secondary | ICD-10-CM | POA: Diagnosis not present

## 2014-09-02 DIAGNOSIS — Z886 Allergy status to analgesic agent status: Secondary | ICD-10-CM

## 2014-09-02 DIAGNOSIS — Z905 Acquired absence of kidney: Secondary | ICD-10-CM | POA: Diagnosis present

## 2014-09-02 DIAGNOSIS — S12600A Unspecified displaced fracture of seventh cervical vertebra, initial encounter for closed fracture: Secondary | ICD-10-CM | POA: Diagnosis not present

## 2014-09-02 DIAGNOSIS — F17200 Nicotine dependence, unspecified, uncomplicated: Secondary | ICD-10-CM | POA: Insufficient documentation

## 2014-09-02 LAB — BLOOD GAS, ARTERIAL
ACID-BASE EXCESS: 7.1 mmol/L — AB (ref 0.0–2.0)
Bicarbonate: 31.9 mEq/L — ABNORMAL HIGH (ref 20.0–24.0)
Drawn by: 312971
O2 CONTENT: 4 L/min
O2 Saturation: 93.5 %
PCO2 ART: 52.7 mmHg — AB (ref 35.0–45.0)
PH ART: 7.399 (ref 7.350–7.450)
Patient temperature: 98.6
TCO2: 33.5 mmol/L (ref 0–100)
pO2, Arterial: 72.2 mmHg — ABNORMAL LOW (ref 80.0–100.0)

## 2014-09-02 MED ORDER — FENTANYL 50 MCG/HR TD PT72
75.0000 ug | MEDICATED_PATCH | TRANSDERMAL | Status: DC
  Administered 2014-09-02: 75 ug via TRANSDERMAL
  Filled 2014-09-02: qty 1

## 2014-09-02 MED ORDER — SODIUM CHLORIDE 0.9 % IJ SOLN
3.0000 mL | Freq: Two times a day (BID) | INTRAMUSCULAR | Status: DC
  Administered 2014-09-02 (×2): 3 mL via INTRAVENOUS

## 2014-09-02 MED ORDER — DULOXETINE HCL 60 MG PO CPEP
60.0000 mg | ORAL_CAPSULE | Freq: Every day | ORAL | Status: DC
  Administered 2014-09-02: 60 mg via ORAL
  Filled 2014-09-02: qty 1

## 2014-09-02 MED ORDER — SULFAMETHOXAZOLE-TRIMETHOPRIM 800-160 MG PO TABS
1.0000 | ORAL_TABLET | Freq: Two times a day (BID) | ORAL | Status: DC
  Administered 2014-09-02 (×2): 1 via ORAL
  Filled 2014-09-02 (×2): qty 1

## 2014-09-02 MED ORDER — GABAPENTIN 600 MG PO TABS
600.0000 mg | ORAL_TABLET | Freq: Three times a day (TID) | ORAL | Status: DC
  Administered 2014-09-02 (×2): 600 mg via ORAL
  Filled 2014-09-02 (×2): qty 1

## 2014-09-02 MED ORDER — VITAMIN C 500 MG PO TABS
1000.0000 mg | ORAL_TABLET | Freq: Every day | ORAL | Status: DC
  Administered 2014-09-02: 1000 mg via ORAL
  Filled 2014-09-02: qty 2

## 2014-09-02 MED ORDER — SODIUM CHLORIDE 0.9 % IV SOLN
INTRAVENOUS | Status: DC
  Administered 2014-09-02: 11:00:00 via INTRAVENOUS

## 2014-09-02 MED ORDER — ACETAMINOPHEN 650 MG RE SUPP: 650.0000 mg | Freq: Four times a day (QID) | RECTAL | Status: DC | PRN

## 2014-09-02 MED ORDER — METOPROLOL TARTRATE 50 MG PO TABS
50.0000 mg | ORAL_TABLET | ORAL | Status: DC | PRN
Start: 2014-09-02 — End: 2014-09-03

## 2014-09-02 MED ORDER — HYDROMORPHONE HCL 1 MG/ML IJ SOLN
0.5000 mg | INTRAMUSCULAR | Status: DC | PRN
  Administered 2014-09-02 – 2014-09-03 (×5): 0.5 mg via INTRAVENOUS
  Filled 2014-09-02 (×5): qty 1

## 2014-09-02 MED ORDER — SUMATRIPTAN 20 MG/ACT NA SOLN
1.0000 | NASAL | Status: DC | PRN
  Filled 2014-09-02: qty 1

## 2014-09-02 MED ORDER — LAMOTRIGINE 150 MG PO TABS
150.0000 mg | ORAL_TABLET | Freq: Every day | ORAL | Status: DC
  Administered 2014-09-02: 150 mg via ORAL
  Filled 2014-09-02: qty 1

## 2014-09-02 MED ORDER — ONDANSETRON HCL 4 MG/2ML IJ SOLN: 4.0000 mg | Freq: Four times a day (QID) | INTRAMUSCULAR | Status: DC | PRN

## 2014-09-02 MED ORDER — ACETAMINOPHEN 325 MG PO TABS: 650.0000 mg | ORAL_TABLET | Freq: Four times a day (QID) | ORAL | Status: DC | PRN

## 2014-09-02 MED ORDER — ALUM & MAG HYDROXIDE-SIMETH 200-200-20 MG/5ML PO SUSP: 30.0000 mL | Freq: Four times a day (QID) | ORAL | Status: DC | PRN

## 2014-09-02 MED ORDER — HYDROCHLOROTHIAZIDE 12.5 MG PO CAPS
25.0000 mg | ORAL_CAPSULE | Freq: Every day | ORAL | Status: DC
  Administered 2014-09-02: 25 mg via ORAL
  Filled 2014-09-02: qty 2

## 2014-09-02 MED ORDER — ONDANSETRON HCL 4 MG PO TABS: 4.0000 mg | ORAL_TABLET | Freq: Four times a day (QID) | ORAL | Status: DC | PRN

## 2014-09-02 MED ORDER — LISINOPRIL 20 MG PO TABS
20.0000 mg | ORAL_TABLET | Freq: Two times a day (BID) | ORAL | Status: DC
  Administered 2014-09-02 (×2): 20 mg via ORAL
  Filled 2014-09-02 (×2): qty 1

## 2014-09-02 MED ORDER — HYDROMORPHONE HCL 1 MG/ML IJ SOLN: 0.5000 mg | INTRAMUSCULAR | Status: DC | PRN

## 2014-09-02 MED ORDER — PANTOPRAZOLE SODIUM 40 MG IV SOLR
40.0000 mg | INTRAVENOUS | Status: DC
  Administered 2014-09-02: 40 mg via INTRAVENOUS
  Filled 2014-09-02 (×2): qty 40

## 2014-09-02 MED ORDER — FENOFIBRATE 160 MG PO TABS
160.0000 mg | ORAL_TABLET | Freq: Every day | ORAL | Status: DC
  Administered 2014-09-02: 160 mg via ORAL
  Filled 2014-09-02: qty 1

## 2014-09-02 NOTE — Progress Notes (Signed)
[facility]) notified of approved transfer to [facility]. Transportation pending.

## 2014-09-02 NOTE — Progress Notes (Signed)
64 year old lady with h/o multiple C spine surgeries in the past, and COPD , actively smoking , comes in for a fall. She was found to have a C7 fracture. Initially Dr Erlinda Hong was consulted and referred to neuro surgery. Patient requested transfer to [facility].  Called Transfer center at [facility] and awaiting call back.  Meanwhile patient has a h/o of COPD and is actively smoking. She is hypercapnic and is requiring 3 liters of [place] oxygen to keep sats greater than 93%.CXR ordered. ABG ordered.  Continue to monitor.  Hosie Poisson, MD [phone]

## 2014-09-02 NOTE — H&P (Signed)
[facility] Admission History and Physical       Theresa Pennington WVP:[id number] DOB: 1950/12/24 DOA: 09/02/2014  Referring physician:  PCP: Default, Provider, MD  Specialists:   Chief Complaint: Fall   HPI: Theresa Pennington is a 64 y.o. female with a history of chronic Back pain S/P Multiple Back Surgeries who was seen at the [facility] after she suffered a fall in her home.  She reports that she was beside her bed leaning toward her television to change the channel  and she fell backward hitting her neck against the doorway, and when she hit the doorway she heard a pop.   She denies any dizziness or LOC.   She was able to call her son on the telephone and he came and took her to the ED.  In the ED she was evaluated and was found to have fractures of C2 and C7, and Dr Erlinda Hong of [facility] was contacted and arrangements were made for admission to [facility] .    She was placed on a C-Collar.   On arrival to [facility] she is awake and alert and reports her pain is currently controlled, she is wearing a 75 mcg Fentanyl patch.     Of note she reports that she has had frequent spells of falling backward.       Review of Systems:   Constitutional: No Weight Loss, No Weight Gain, Night Sweats, Fevers, Chills, Dizziness, Light Headedness, Fatigue, or Generalized Weakness HEENT: No Headaches, Difficulty Swallowing,Tooth/Dental Problems,Sore Throat,  No Sneezing, Rhinitis, Ear Ache, Nasal Congestion, or Post Nasal Drip,  Cardio-vascular:  No Chest pain, Orthopnea, PND, Edema in Lower Extremities, Anasarca, Dizziness, Palpitations  Resp: No Dyspnea, No DOE, No Productive Cough, No Non-Productive Cough, No Hemoptysis, No Wheezing.    GI: No Heartburn, Indigestion, Abdominal Pain, Nausea, Vomiting, Diarrhea, Constipation, Hematemesis, Hematochezia, Melena, Change in Bowel Habits,  Loss of Appetite  GU: No Dysuria, No Change in Color of Urine, No Urgency or Urinary Frequency,  No Flank pain.  Musculoskeletal: No Joint Pain or Swelling, No Decreased Range of Motion, +Back Pain.  Neurologic: No Syncope, No Seizures, Muscle Weakness, Paresthesia, Vision Disturbance or Loss, No Diplopia, No Vertigo but +Loss of Balance, No Difficulty Walking,  Skin: No Rash or Lesions. Psych: No Change in Mood or Affect, No Depression or Anxiety, No Memory loss, No Confusion, or Hallucinations   Past Medical History  Diagnosis Date  . Fibromyalgia   . Bipolar 1 disorder   . Hyperlipemia   . Depression   . Carpal tunnel syndrome   . Cancer     Right Kidney  . Hypertension   . Asthma     hx  . 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 GERD (gastroesophageal reflux disease)   . Headache     migraines     Past Surgical History  Procedure Laterality Date  . Tonsillectomy    . Right kidney removed    . Right knee arthroscopy    . Abdominal hysterectomy    . Neck sx      X3  . Appendectomy    . Radiology with anesthesia N/A 08/15/2014    Procedure: MRI thoracic and lumbar;  Surgeon: Medication Radiologist, MD;  Location: [place];  Service: Radiology;  Laterality: N/A;      Prior to Admission medications   Medication Sig Start Date End Date Taking? Authorizing Provider  alendronate (FOSAMAX) 70  MG tablet Take 70 mg by mouth once a week.     Historical Provider, MD  Ascorbic Acid (VITAMIN C) 1000 MG tablet Take 1,000 mg by mouth daily.    Historical Provider, MD  benzonatate (TESSALON) 100 MG capsule Take 100 mg by mouth 3 (three) times daily as needed. Cough    Historical Provider, MD  cyclobenzaprine (FLEXERIL) 10 MG tablet Take 10 mg by mouth 3 (three) times daily as needed. Spasms    Historical Provider, MD  DULoxetine (CYMBALTA) 60 MG capsule Take 60 mg by mouth daily.    Historical Provider, MD  fenofibrate 160 MG tablet Take 160 mg by mouth daily.    Historical Provider, MD  fentaNYL (DURAGESIC - DOSED MCG/HR) 75 MCG/HR Place 1 patch onto the skin every 3  (three) days.    Historical Provider, MD  furosemide (LASIX) 40 MG tablet Take 40 mg by mouth daily.    Historical Provider, MD  gabapentin (NEURONTIN) 600 MG tablet Take 600 mg by mouth 3 (three) times daily.    Historical Provider, MD  hydrochlorothiazide (MICROZIDE) 12.5 MG capsule Take 25 mg by mouth daily.     Historical Provider, MD  HYDROcodone-acetaminophen (NORCO/VICODIN) 5-325 MG per tablet Take 1 tablet by mouth every 6 (six) hours as needed for moderate pain. 12/03/13   Dalia Heading, PA-C  lamoTRIgine (LAMICTAL) 150 MG tablet Take 150 mg by mouth daily.    Historical Provider, MD  lisinopril (PRINIVIL,ZESTRIL) 20 MG tablet Take 20 mg by mouth 2 (two) times daily.    Historical Provider, MD  LORazepam (ATIVAN) 0.5 MG tablet Take 0.5 mg by mouth every 8 (eight) hours as needed for anxiety.     Historical Provider, MD  magnesium oxide (MAG-OX) 400 MG tablet Take 400 mg by mouth daily.    Historical Provider, MD  Meth-Hyo-M Bl-Na Phos-Ph Sal (URIBEL) 118 MG CAPS Take 118 mg by mouth daily.     Historical Provider, MD  metoprolol (LOPRESSOR) 50 MG tablet Take 50 mg by mouth as needed (if pressure is over 190).  03/04/13   Historical Provider, MD  mometasone (NASONEX) 50 MCG/ACT nasal spray Place 2 sprays into the nose daily as needed (congestion).     Historical Provider, MD  Multiple Vitamins-Minerals (ZINC) LOZG Take 1 tablet by mouth daily.    Historical Provider, MD  oxyCODONE-acetaminophen (PERCOCET) 10-325 MG per tablet Take 1 tablet by mouth every 4 (four) hours as needed. Pain    Historical Provider, MD  polyethylene glycol powder (GLYCOLAX/MIRALAX) powder Take by mouth 2 (two) times daily.     Historical Provider, MD  potassium chloride (MICRO-K) 10 MEQ CR capsule Take 10 mEq by mouth daily.     Historical Provider, MD  pravastatin (PRAVACHOL) 20 MG tablet Take 20 mg by mouth daily.    Historical Provider, MD  risedronate (ACTONEL) 5 MG tablet Take 5 mg by mouth daily before  breakfast.     Historical Provider, MD  SUMAtriptan (IMITREX) 20 MG/ACT nasal spray Place 1 spray into the nose every 2 (two) hours as needed. Migraine    Historical Provider, MD  VOLTAREN 1 % GEL Apply 4 g topically as needed (pain).     Historical Provider, MD     Allergies  Allergen Reactions  . Anaprox [Naproxen]   . Aspirin   . Corticosteroids   . Lyrica [Pregabalin]   . Robaxin [Methocarbamol]     Social History:  reports that she has been smoking Cigarettes.  She has  a 74 pack-year smoking history. She does not have any smokeless tobacco history on file. She reports that she does not drink alcohol or use illicit drugs.    Family History  Problem Relation Age of Onset  . Emphysema Father   . Heart disease Mother   . Heart disease Father   . Cancer Father   . High Cholesterol Mother   . Hypertension Mother   . Diabetes Mother        Physical Exam:  GEN:  Pleasant  64 y.o. female examined and in no acute distress; cooperative with exam Filed Vitals:   09/02/14 0531  BP: 167/63  Pulse: 103  Temp: 98.6 F (37 C)  TempSrc: Oral  Resp: 16  Height: 5' (1.524 m)  Weight: 59.6 kg (131 lb 6.3 oz)  SpO2: 92%   Blood pressure 167/63, pulse 103, temperature 98.6 F (37 C), temperature source Oral, resp. rate 16, height 5' (1.524 m), weight 59.6 kg (131 lb 6.3 oz), SpO2 92 %. PSYCH: She is alert and oriented x4; does not appear anxious does not appear depressed; affect is normal HEENT: Normocephalic and Atraumatic, Mucous membranes pink; PERRLA; EOM intact; Fundi:  Benign;  No scleral icterus, Nares: Patent, Oropharynx: Clear, Dentures Present,    Neck:  FROM, No Cervical Lymphadenopathy nor Thyromegaly or Carotid Bruit; No JVD; Breasts:: Not examined CHEST WALL: No tenderness CHEST: Normal respiration, clear to auscultation bilaterally HEART: Regular rate and rhythm; no murmurs rubs or gallops BACK: No kyphosis or scoliosis; No CVA tenderness ABDOMEN: Positive Bowel  Sounds, Scaphoid, Obese, Soft Non-Tender, No Rebound or Guarding; No Masses, No Organomegaly, No Pannus; No Intertriginous candida. Rectal Exam: Not done EXTREMITIES: No Cyanosis, Clubbing, or Edema; No Ulcerations. Genitalia: not examined PULSES: 2+ and symmetric SKIN: Normal hydration no rash or ulceration CNS:  Alert and Oriented x 4, No Focal Deficits  Gait: deferred  Vascular: pulses palpable throughout    Labs on Admission:  Basic Metabolic Panel: No results for input(s): NA, K, CL, CO2, GLUCOSE, BUN, CREATININE, CALCIUM, MG, PHOS in the last 168 hours. Liver Function Tests: No results for input(s): AST, ALT, ALKPHOS, BILITOT, PROT, ALBUMIN in the last 168 hours. No results for input(s): LIPASE, AMYLASE in the last 168 hours. No results for input(s): AMMONIA in the last 168 hours. CBC: No results for input(s): WBC, NEUTROABS, HGB, HCT, MCV, PLT in the last 168 hours. Cardiac Enzymes: No results for input(s): CKTOTAL, CKMB, CKMBINDEX, TROPONINI in the last 168 hours.  BNP (last 3 results) No results for input(s): BNP in the last 8760 hours.  ProBNP (last 3 results) No results for input(s): PROBNP in the last 8760 hours.  CBG: No results for input(s): GLUCAP in the last 168 hours.  Radiological Exams on Admission: No results found.   EKG: Independently reviewed.    Assessment/Plan:   64 y.o. female with   Active Problems:     A:   C2 cervical fracture and C7 Fractures S/P Fall- on CT scan        P:     Admitted to a telemetry bed, and spoke to Dr Erlinda Hong and notified him of patients arrival.  The plan is for Dr. Erlinda Hong to evaluate the patient and determine whether she requires surgery.  The Fentanyl  patch  at 75 mcg/hr has been ordered for pain control Fentanyl and appears to be controlling the pain at this time, however the patient may be falling due to overmedication.   SCD have been ordered  for DVT prophylaxis.   Use Disorder     Tobacco Use Disorder- A Nicotine patch  has also been ordered.              Code Status:     FULL CODE      Family Communication:   Son at Bedside    Disposition Plan:    Inpatient  Status        Time spent:  [facility] Pager [phone]   If Sylvia Please Contact the Day Rounding Team MD for [facility]  If 7PM-7AM, Please Contact Night-Floor Coverage  [email or web address] Password [email or web address] 09/02/2014, 6:17 AM     ADDENDUM:   Patient was seen and examined on 09/02/2014

## 2014-09-02 NOTE — Progress Notes (Signed)
Patient's son at bedside.

## 2014-09-02 NOTE — Discharge Summary (Signed)
Physician Discharge Summary  Theresa Pennington KDT:[id number] DOB: Jun 23, 1950 DOA: 09/02/2014  PCP: Default, Provider, MD  Admit date: 09/02/2014 Discharge date: 09/02/2014    Discharge Diagnoses:  Active Problems:    Cervical spine  fracture   Discharge Condition: stable  Diet recommendation: regular.  Filed Weights   09/02/14 0531  Weight: 59.6 kg (131 lb 6.3 oz)    History of present illness/Hospital Course:   64 year old lady with h/o multiple C spine surgeries in the past, and COPD , actively smoking , comes in for a fall. She was found to have a C7 fracture. Initially Dr Erlinda Hong was consulted and referred to neuro surgery. Patient requested transfer to [facility].  Called Transfer center at [facility] and Dr Jessica Priest accepted the patient to the floor. .  Meanwhile patient has a h/o of COPD and is actively smoking. She is hypercapnic and is requiring 3 liters of [place] oxygen to keep sats greater than 93%. HER CXR does not reveal any acute abnormalities.  Procedures:  none  Consultations: Neuro surgery  Discharge Exam: Filed Vitals:   09/02/14 1231  BP: 149/66  Pulse: 105  Temp: 99.9 F (37.7 C)  Resp: 14    General: alert afebrile in mild distress Cardiovascular:s1s2 Respiratory: ctab  Discharge Instructions    Current Discharge Medication List    CONTINUE these medications which have NOT CHANGED   Details  Ascorbic Acid (VITAMIN C) 1000 MG tablet Take 1,000 mg by mouth daily.    cyclobenzaprine (FLEXERIL) 10 MG tablet Take 10 mg by mouth 3 (three) times daily as needed. Spasms    DULoxetine (CYMBALTA) 60 MG capsule Take 60 mg by mouth daily.    fenofibrate 160 MG tablet Take 160 mg by mouth daily.    fentaNYL (DURAGESIC - DOSED MCG/HR) 75 MCG/HR Place 1 patch onto the skin every 3 (three) days.    furosemide (LASIX) 40 MG tablet Take 40 mg by mouth daily.    gabapentin (NEURONTIN) 600 MG tablet Take 600 mg by mouth 3 (three) times daily.    hydrochlorothiazide  (MICROZIDE) 12.5 MG capsule Take 25 mg by mouth daily.     lamoTRIgine (LAMICTAL) 150 MG tablet Take 150 mg by mouth daily.    lisinopril (PRINIVIL,ZESTRIL) 20 MG tablet Take 20 mg by mouth 2 (two) times daily.    LORazepam (ATIVAN) 0.5 MG tablet Take 0.5 mg by mouth every 8 (eight) hours as needed for anxiety.     magnesium oxide (MAG-OX) 400 MG tablet Take 400 mg by mouth daily.    Meth-Hyo-M Bl-Na Phos-Ph Sal (URIBEL) 118 MG CAPS Take 118 mg by mouth daily.     metoprolol (LOPRESSOR) 50 MG tablet Take 50 mg by mouth as needed (if pressure is over 190).     mometasone (NASONEX) 50 MCG/ACT nasal spray Place 2 sprays into the nose daily as needed (congestion).     Multiple Vitamins-Minerals (ZINC) LOZG Take 1 tablet by mouth daily.    polyethylene glycol powder (GLYCOLAX/MIRALAX) powder Take by mouth 2 (two) times daily.     potassium chloride (MICRO-K) 10 MEQ CR capsule Take 10 mEq by mouth daily.     pravastatin (PRAVACHOL) 20 MG tablet Take 20 mg by mouth daily.    risedronate (ACTONEL) 5 MG tablet Take 5 mg by mouth daily before breakfast.     sulfamethoxazole-trimethoprim (BACTRIM DS,SEPTRA DS) 800-160 MG per tablet Take 1 tablet by mouth 2 (two) times daily.    SUMAtriptan (IMITREX) 20 MG/ACT  nasal spray Place 1 spray into the nose every 2 (two) hours as needed. Migraine    VOLTAREN 1 % GEL Apply 4 g topically as needed (pain).     alendronate (FOSAMAX) 70 MG tablet Take 70 mg by mouth once a week.     HYDROcodone-acetaminophen (NORCO/VICODIN) 5-325 MG per tablet Take 1 tablet by mouth every 6 (six) hours as needed for moderate pain. Qty: 15 tablet, Refills: 0       Allergies  Allergen Reactions  . Anaprox [Naproxen]   . Aspirin   . Corticosteroids   . Lyrica [Pregabalin]   . Robaxin [Methocarbamol]       The results of significant diagnostics from this hospitalization (including imaging, microbiology, ancillary and laboratory) are listed below for reference.     Significant Diagnostic Studies: Mr Cervical Spine Wo Contrast  08/15/2014   CLINICAL DATA:  Chronic left arm pain and weakness, worsening. Recurrent falls. History of spinal stenosis secondary to a remote T12 fracture.  EXAM: MRI CERVICAL, THORACIC AND LUMBAR SPINE WITHOUT CONTRAST  TECHNIQUE: Multiplanar and multiecho pulse sequences of the cervical spine, to include the craniocervical junction and cervicothoracic junction, and thoracic and lumbar spine, were obtained without intravenous contrast.  COMPARISON:  MRI cervical spine 02/10/2008. Plain films cervical spine 08/23/2008.  FINDINGS: MRI CERVICAL SPINE FINDINGS  The study is limited by fusion hardware.  The patient is status post anterior cervical fusion from C3-C5 and posterior fusion from C2-T1. Mild kyphosis about the C6-7 level is identified and not notably changed compared to the prior plain films. Vertebral body height is maintained and there is no worrisome marrow lesion. The craniocervical junction is normal. A small focus of edema is seen within the cervical cord at the C4 level on image 7 of series 500.  The oral cavity and pharynx and hypopharynx appear markedly distended on all images. Cause for this finding is not identified.  C2-3: Negative.  C3-4: On the axial sequences, the cord appears somewhat flattened but this is likely artifactual as it appears to have a normal configuration on sagittal imaging. The patient is status post fusion. The central canal and foramina are open.  C4-5: Status post fusion. The central canal and foramina appear open.  C5-6: Status post fusion. The central canal appears open. Uncovertebral disease causes mild bilateral foraminal narrowing.  C6-7: Status post fusion. The central canal and foramina appear open. There is mild uncovertebral disease.  C7-T1: Status post fusion. The central canal and foramina are open.  MRI THORACIC SPINE  The patient has a remote compression fracture of T12 with up to 90% vertebral  body height loss anteriorly and bony retropulsion as described below. The thoracic cord demonstrates normal signal. The T1-2 to T10-11 demonstrate a widely patent central canal and foramina with a very small left paracentral protrusion noted at T6-7. The T11-12 and lumbar spine levels are described below.  MRI LUMBAR SPINE FINDINGS  The patient has a remote superior endplate compression fracture of T12 with vertebral body height loss of up to 90%. There is bony retropulsion off the superior endplate. The T12 vertebral body is retrolisthesed 0.3 cm relative to L1. 0.6 cm anterolisthesis L4 on L5 is identified. The distal cord demonstrates normal signal. The conus medullaris is normal in position. Imaged intra-abdominal contents are unremarkable.  T11-12: There is facet degenerative disease, worse on the left. Retropulsed bone off the posterior margin of T12 nearly contacts the cord but the central canal and foramina appear open.  T12-L1:  Retrolisthesis and a disc bulge eccentric to the left are identified. The central canal is mildly narrowed. The left foramen appears severely narrowed. The right foramen is open.  L1-2:  Negative.  L2-3: There is facet arthropathy, more notable on the right and a minimal disc bulge. The central canal and foramina remain open.  L3-4: Mild ligamentum flavum thickening is identified and there is a minimal disc bulge. The central canal and foramina remain open.  L4-5: Advanced bilateral facet degenerative change is present and there is ligamentum flavum thickening. The disc is uncovered with a bulge. Severe central canal and lateral recess stenosis is present and there is mild to moderate right foraminal narrowing. The left foramen appears open.  L5-S1: Shallow disc bulge is identified and there is some facet degenerative disease. The central canal and foramina appear open.  IMPRESSION: CERVICAL SPINE:  Status post cervical fusion as described. The central canal appears open at all  levels. 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  Negative for cervical stenosis.  Marked distention of the hypopharynx and pharynx. Cause for this finding is not identified and correlation with esophagram is recommended. The patient may have a Zenker's diverticulum.  THORACIC SPINE:  Remote T12 compression fracture is described below.  Small left paracentral protrusion at T6-7. The central canal and foramina are patent at all levels.  LUMBAR SPINE:  Remote T12 compression fracture with bony retropulsion causes only mild central canal narrowing. The left foramen is markedly narrowed.  Spondylosis appearing worst at L4-5 where there is 0.6 cm anterolisthesis due to advanced facet degenerative disease and bulky ligamentum flavum thickening. The central canal and lateral recesses appear markedly narrowed at this level and there is also right foraminal narrowing.  Marked left foraminal narrowing T12-L1 due to disc and retrolisthesis of T12 on L1.   Electronically Signed   By: Inge Rise M.D.   On: 08/15/2014 12:57   Mr Thoracic Spine Wo Contrast  08/15/2014   CLINICAL DATA:  Chronic left arm pain and weakness, worsening. Recurrent falls. History of spinal stenosis secondary to a remote T12 fracture.  EXAM: MRI CERVICAL, THORACIC AND LUMBAR SPINE WITHOUT CONTRAST  TECHNIQUE: Multiplanar and multiecho pulse sequences of the cervical spine, to include the craniocervical junction and cervicothoracic junction, and thoracic and lumbar spine, were obtained without intravenous contrast.  COMPARISON:  MRI cervical spine 02/10/2008. Plain films cervical spine 08/23/2008.  FINDINGS: MRI CERVICAL SPINE FINDINGS  The study is limited by fusion hardware.  The patient is status post anterior cervical fusion from C3-C5 and posterior fusion from C2-T1. Mild kyphosis about the C6-7 level is identified and not notably changed compared to the  prior plain films. Vertebral body height is maintained and there is no worrisome marrow lesion. The craniocervical junction is normal. A small focus of edema is seen within the cervical cord at the C4 level on image 7 of series 500.  The oral cavity and pharynx and hypopharynx appear markedly distended on all images. Cause for this finding is not identified.  C2-3: Negative.  C3-4: On the axial sequences, the cord appears somewhat flattened but this is likely artifactual as it appears to have a normal configuration on sagittal imaging. The patient is status post fusion. The central canal and foramina are open.  C4-5: Status post fusion. The central canal and foramina appear open.  C5-6: Status post fusion. The central canal appears open.  Uncovertebral disease causes mild bilateral foraminal narrowing.  C6-7: Status post fusion. The central canal and foramina appear open. There is mild uncovertebral disease.  C7-T1: Status post fusion. The central canal and foramina are open.  MRI THORACIC SPINE  The patient has a remote compression fracture of T12 with up to 90% vertebral body height loss anteriorly and bony retropulsion as described below. The thoracic cord demonstrates normal signal. The T1-2 to T10-11 demonstrate a widely patent central canal and foramina with a very small left paracentral protrusion noted at T6-7. The T11-12 and lumbar spine levels are described below.  MRI LUMBAR SPINE FINDINGS  The patient has a remote superior endplate compression fracture of T12 with vertebral body height loss of up to 90%. There is bony retropulsion off the superior endplate. The T12 vertebral body is retrolisthesed 0.3 cm relative to L1. 0.6 cm anterolisthesis L4 on L5 is identified. The distal cord demonstrates normal signal. The conus medullaris is normal in position. Imaged intra-abdominal contents are unremarkable.  T11-12: There is facet degenerative disease, worse on the left. Retropulsed bone off the posterior  margin of T12 nearly contacts the cord but the central canal and foramina appear open.  T12-L1: Retrolisthesis and a disc bulge eccentric to the left are identified. The central canal is mildly narrowed. The left foramen appears severely narrowed. The right foramen is open.  L1-2:  Negative.  L2-3: There is facet arthropathy, more notable on the right and a minimal disc bulge. The central canal and foramina remain open.  L3-4: Mild ligamentum flavum thickening is identified and there is a minimal disc bulge. The central canal and foramina remain open.  L4-5: Advanced bilateral facet degenerative change is present and there is ligamentum flavum thickening. The disc is uncovered with a bulge. Severe central canal and lateral recess stenosis is present and there is mild to moderate right foraminal narrowing. The left foramen appears open.  L5-S1: Shallow disc bulge is identified and there is some facet degenerative disease. The central canal and foramina appear open.  IMPRESSION: CERVICAL SPINE:  Status post cervical fusion as described. The central canal appears open at all levels. 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  Negative for cervical stenosis.  Marked distention of the hypopharynx and pharynx. Cause for this finding is not identified and correlation with esophagram is recommended. The patient may have a Zenker's diverticulum.  THORACIC SPINE:  Remote T12 compression fracture is described below.  Small left paracentral protrusion at T6-7. The central canal and foramina are patent at all levels.  LUMBAR SPINE:  Remote T12 compression fracture with bony retropulsion causes only mild central canal narrowing. The left foramen is markedly narrowed.  Spondylosis appearing worst at L4-5 where there is 0.6 cm anterolisthesis due to advanced facet degenerative disease and bulky ligamentum flavum thickening. The central canal and  lateral recesses appear markedly narrowed at this level and there is also right foraminal narrowing.  Marked left foraminal narrowing T12-L1 due to disc and retrolisthesis of T12 on L1.   Electronically Signed   By: Inge Rise M.D.   On: 08/15/2014 12:57   Mr Lumbar Spine Wo Contrast  08/15/2014   CLINICAL DATA:  Chronic left arm pain and weakness, worsening. Recurrent falls. History of spinal stenosis secondary to a remote T12 fracture.  EXAM: MRI CERVICAL, THORACIC AND LUMBAR SPINE WITHOUT CONTRAST  TECHNIQUE: Multiplanar and multiecho pulse sequences  of the cervical spine, to include the craniocervical junction and cervicothoracic junction, and thoracic and lumbar spine, were obtained without intravenous contrast.  COMPARISON:  MRI cervical spine 02/10/2008. Plain films cervical spine 08/23/2008.  FINDINGS: MRI CERVICAL SPINE FINDINGS  The study is limited by fusion hardware.  The patient is status post anterior cervical fusion from C3-C5 and posterior fusion from C2-T1. Mild kyphosis about the C6-7 level is identified and not notably changed compared to the prior plain films. Vertebral body height is maintained and there is no worrisome marrow lesion. The craniocervical junction is normal. A small focus of edema is seen within the cervical cord at the C4 level on image 7 of series 500.  The oral cavity and pharynx and hypopharynx appear markedly distended on all images. Cause for this finding is not identified.  C2-3: Negative.  C3-4: On the axial sequences, the cord appears somewhat flattened but this is likely artifactual as it appears to have a normal configuration on sagittal imaging. The patient is status post fusion. The central canal and foramina are open.  C4-5: Status post fusion. The central canal and foramina appear open.  C5-6: Status post fusion. The central canal appears open. Uncovertebral disease causes mild bilateral foraminal narrowing.  C6-7: Status post fusion. The central canal and  foramina appear open. There is mild uncovertebral disease.  C7-T1: Status post fusion. The central canal and foramina are open.  MRI THORACIC SPINE  The patient has a remote compression fracture of T12 with up to 90% vertebral body height loss anteriorly and bony retropulsion as described below. The thoracic cord demonstrates normal signal. The T1-2 to T10-11 demonstrate a widely patent central canal and foramina with a very small left paracentral protrusion noted at T6-7. The T11-12 and lumbar spine levels are described below.  MRI LUMBAR SPINE FINDINGS  The patient has a remote superior endplate compression fracture of T12 with vertebral body height loss of up to 90%. There is bony retropulsion off the superior endplate. The T12 vertebral body is retrolisthesed 0.3 cm relative to L1. 0.6 cm anterolisthesis L4 on L5 is identified. The distal cord demonstrates normal signal. The conus medullaris is normal in position. Imaged intra-abdominal contents are unremarkable.  T11-12: There is facet degenerative disease, worse on the left. Retropulsed bone off the posterior margin of T12 nearly contacts the cord but the central canal and foramina appear open.  T12-L1: Retrolisthesis and a disc bulge eccentric to the left are identified. The central canal is mildly narrowed. The left foramen appears severely narrowed. The right foramen is open.  L1-2:  Negative.  L2-3: There is facet arthropathy, more notable on the right and a minimal disc bulge. The central canal and foramina remain open.  L3-4: Mild ligamentum flavum thickening is identified and there is a minimal disc bulge. The central canal and foramina remain open.  L4-5: Advanced bilateral facet degenerative change is present and there is ligamentum flavum thickening. The disc is uncovered with a bulge. Severe central canal and lateral recess stenosis is present and there is mild to moderate right foraminal narrowing. The left foramen appears open.  L5-S1: Shallow  disc bulge is identified and there is some facet degenerative disease. The central canal and foramina appear open.  IMPRESSION: CERVICAL SPINE:  Status post cervical fusion as described. The central canal appears open at all levels. 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  Negative for cervical stenosis.  Marked distention of the hypopharynx and pharynx. Cause for this finding is not identified and correlation with esophagram is recommended. The patient may have a Zenker's diverticulum.  THORACIC SPINE:  Remote T12 compression fracture is described below.  Small left paracentral protrusion at T6-7. The central canal and foramina are patent at all levels.  LUMBAR SPINE:  Remote T12 compression fracture with bony retropulsion causes only mild central canal narrowing. The left foramen is markedly narrowed.  Spondylosis appearing worst at L4-5 where there is 0.6 cm anterolisthesis due to advanced facet degenerative disease and bulky ligamentum flavum thickening. The central canal and lateral recesses appear markedly narrowed at this level and there is also right foraminal narrowing.  Marked left foraminal narrowing T12-L1 due to disc and retrolisthesis of T12 on L1.   Electronically Signed   By: Inge Rise M.D.   On: 08/15/2014 12:57   Dg Chest Port 1 View  09/02/2014   CLINICAL DATA:  Hypoxia.  Hypertension.  Smoker.  EXAM: PORTABLE CHEST - 1 VIEW  COMPARISON:  07/27/2012  FINDINGS: Low cervical spine fixation. Remote right rib trauma. Midline trachea. Mild cardiomegaly. Atherosclerosis in the transverse aorta. No pleural effusion or pneumothorax. Scarring at the left lung base.  IMPRESSION: No acute cardiopulmonary disease.   Electronically Signed   By: Abigail Miyamoto M.D.   On: 09/02/2014 17:55    Microbiology: No results found for this or any previous visit (from the past 240 hour(s)).   Labs: Basic Metabolic  Panel: No results for input(s): NA, K, CL, CO2, GLUCOSE, BUN, CREATININE, CALCIUM, MG, PHOS in the last 168 hours. Liver Function Tests: No results for input(s): AST, ALT, ALKPHOS, BILITOT, PROT, ALBUMIN in the last 168 hours. No results for input(s): LIPASE, AMYLASE in the last 168 hours. No results for input(s): AMMONIA in the last 168 hours. CBC: No results for input(s): WBC, NEUTROABS, HGB, HCT, MCV, PLT in the last 168 hours. Cardiac Enzymes: No results for input(s): CKTOTAL, CKMB, CKMBINDEX, TROPONINI in the last 168 hours. BNP: BNP (last 3 results) No results for input(s): BNP in the last 8760 hours.  ProBNP (last 3 results) No results for input(s): PROBNP in the last 8760 hours.  CBG: No results for input(s): GLUCAP in the last 168 hours.     SignedHosie Poisson  [facility] 09/02/2014, 6:51 PM

## 2014-09-02 NOTE — Progress Notes (Signed)
Patients son Legrand Como [phone] notified that patient is approved for transfer to [facility]. Son stated that either him or his brother will be back to be with the patient prior to transport. However RN informed the son that transportation may be here before him or his brother arrives. Son stated that it is ok to transport patient prior to either son arriving. RN gave son the patients room number (4107). Nursing will continue to monitor.

## 2014-09-02 NOTE — Consult Note (Signed)
Patient has had a complex history with her neck.  Her surgeries were performed at [facility] by neurosurgery.  Discussed case with Dr. Louanne Skye who has seen her for her back.  We recommend a neurosurgery consult for her acute Dens and C7 fx.  Needs to remain in c collar at all times.  Patient advised to keep B[place] < 30.  Ortho signed off.    Azucena Cecil, MD [place] 8:26 AM

## 2014-09-02 NOTE — Progress Notes (Signed)
[facility] notified of transportation request to [facility] (room [phone]). Transportation en route. Nursing will continue to monitor.

## 2014-09-02 NOTE — Progress Notes (Signed)
[facility] notified that patient is transporting to [facility].

## 2014-09-02 NOTE — Progress Notes (Signed)
Patient ID: Theresa Pennington, female   DOB: [birth year], 63 y.o.   MRN: [id number] Called by doctor Karleen Hampshire to see [name]. Unable to find ct spine. Patient is under the care of doctor Louanne Skye who was planning to do lumbar fusion on her. He did send the patient to doctor Marvel Plan at [facility] for neck surgery in the past. Since she is under the care of doctor Louanne Skye we will leave the care to him. Patient do not mind to go back to dr Marvel Plan. She is on a brace . DR  Karleen Hampshire was made aware

## 2014-09-03 NOTE — Progress Notes (Signed)
[facility] at bedside 

## 2014-09-03 NOTE — Progress Notes (Signed)
Patient transporting to [facility].

## 2014-09-26 DIAGNOSIS — S12600A Unspecified displaced fracture of seventh cervical vertebra, initial encounter for closed fracture: Secondary | ICD-10-CM | POA: Insufficient documentation

## 2014-09-26 DIAGNOSIS — F17200 Nicotine dependence, unspecified, uncomplicated: Secondary | ICD-10-CM | POA: Insufficient documentation

## 2014-10-01 DEATH — deceased

## 2014-11-27 ENCOUNTER — Other Ambulatory Visit: Payer: Self-pay

## 2023-12-18 ENCOUNTER — Encounter: Payer: Self-pay | Admitting: Advanced Practice Midwife
